# Patient Record
Sex: Male | Born: 1975 | Race: Black or African American | Hispanic: No | Marital: Single | Smoking: Former smoker
Health system: Southern US, Community
[De-identification: ages and names within clinical notes are randomized; demographics above are authoritative.]

## PROBLEM LIST (undated history)

## (undated) DIAGNOSIS — I639 Cerebral infarction, unspecified: Secondary | ICD-10-CM

## (undated) DIAGNOSIS — R011 Cardiac murmur, unspecified: Secondary | ICD-10-CM

## (undated) HISTORY — PX: EYE SURGERY: SHX253

---

## 2013-06-21 ENCOUNTER — Emergency Department (HOSPITAL_COMMUNITY)
Admission: EM | Admit: 2013-06-21 | Discharge: 2013-06-21 | Disposition: A | Discharge status: Home / Self Care | Payer: Self-pay | Attending: Emergency Medicine | Admitting: Emergency Medicine

## 2013-06-21 ENCOUNTER — Emergency Department (HOSPITAL_COMMUNITY): Payer: Self-pay

## 2013-06-21 ENCOUNTER — Encounter (HOSPITAL_COMMUNITY): Payer: Self-pay | Admitting: Emergency Medicine

## 2013-06-21 DIAGNOSIS — R0602 Shortness of breath: Secondary | ICD-10-CM | POA: Insufficient documentation

## 2013-06-21 DIAGNOSIS — R079 Chest pain, unspecified: Secondary | ICD-10-CM | POA: Insufficient documentation

## 2013-06-21 DIAGNOSIS — Z87891 Personal history of nicotine dependence: Secondary | ICD-10-CM | POA: Insufficient documentation

## 2013-06-21 LAB — I-STAT TROPONIN, ED: Troponin i, poc: 0 ng/mL (ref 0.00–0.08)

## 2013-06-21 LAB — CBC
HEMATOCRIT: 36.5 % — AB (ref 39.0–52.0)
Hemoglobin: 12 g/dL — ABNORMAL LOW (ref 13.0–17.0)
MCH: 30.2 pg (ref 26.0–34.0)
MCHC: 32.9 g/dL (ref 30.0–36.0)
MCV: 91.9 fL (ref 78.0–100.0)
PLATELETS: 185 10*3/uL (ref 150–400)
RBC: 3.97 MIL/uL — ABNORMAL LOW (ref 4.22–5.81)
RDW: 12.5 % (ref 11.5–15.5)
WBC: 5.2 10*3/uL (ref 4.0–10.5)

## 2013-06-21 LAB — BASIC METABOLIC PANEL
BUN: 14 mg/dL (ref 6–23)
CO2: 26 mEq/L (ref 19–32)
Calcium: 9.1 mg/dL (ref 8.4–10.5)
Chloride: 102 mEq/L (ref 96–112)
Creatinine, Ser: 0.94 mg/dL (ref 0.50–1.35)
Glucose, Bld: 87 mg/dL (ref 70–99)
POTASSIUM: 4.3 meq/L (ref 3.7–5.3)
Sodium: 141 mEq/L (ref 137–147)

## 2013-06-21 LAB — PRO B NATRIURETIC PEPTIDE: PRO B NATRI PEPTIDE: 36.5 pg/mL (ref 0–125)

## 2013-06-21 MED ORDER — ASPIRIN 81 MG PO CHEW: 324.0000 mg | CHEWABLE_TABLET | Freq: Once | ORAL | Status: DC

## 2013-06-21 MED ORDER — MORPHINE SULFATE 4 MG/ML IJ SOLN: 4.0000 mg | Freq: Once | INTRAMUSCULAR | Status: DC

## 2013-06-21 MED ORDER — NITROGLYCERIN 0.4 MG SL SUBL: 0.4000 mg | SUBLINGUAL_TABLET | SUBLINGUAL | Status: DC | PRN

## 2013-06-21 NOTE — ED Provider Notes (Signed)
Patient seen/examined in the Emergency Department [facility] Patient reports chest pain.  He reports he has had this for awhile Exam : awake/alert, maex4, no murmurs noted.  Lung sounds clear Plan: d/c home with cardiology followup.  I doubt ACS/PE/Dissection at this time BP 133/88  Pulse 65  Temp(Src) 98.4 F (36.9 C) (Oral)  Resp 16  Ht 5\' 11"  (1.803 m)  Wt 171 lb 9.6 oz (77.837 kg)  BMI 23.94 kg/m2  SpO2 99%   Joya Gaskinsonald W Jonanthony Nahar, MD 06/21/13 [id number]

## 2013-06-21 NOTE — ED Notes (Signed)
Patient transported to X-ray 

## 2013-06-21 NOTE — ED Notes (Signed)
Patient c/o chest pain that he states has been going on and off for years, unable to describe quality of pain very well but states it feels like pressure only when suggested, pain is reproduced when patient moves his arms.  Pain is intermittent and midsternal, no associated nausea but states he felt sob and hot last night.  States when it comes he feels dizzy.  He denies medical hx but states he has a brother on his 2nd pacemaker who is [phone], unable to give more information

## 2013-06-21 NOTE — ED Notes (Signed)
Pt states he was in prison for 19 years, released 8 days ago, and all testing have been completed through the prison system not an actual Cardiologist

## 2013-06-21 NOTE — ED Notes (Signed)
Patient left before receiving morphine or aspirin

## 2013-06-21 NOTE — Discharge Instructions (Signed)
Chest Pain (Nonspecific) °It is often hard to give a specific diagnosis for the cause of chest pain. There is always a chance that your pain could be related to something serious, such as a heart attack or a blood clot in the lungs. You need to follow up with your caregiver for further evaluation. °CAUSES  °· Heartburn. °· Pneumonia or bronchitis. °· Anxiety or stress. °· Inflammation around your heart (pericarditis) or lung (pleuritis or pleurisy). °· A blood clot in the lung. °· A collapsed lung (pneumothorax). It can develop suddenly on its own (spontaneous pneumothorax) or from injury (trauma) to the chest. °· Shingles infection (herpes zoster virus). °The chest wall is composed of bones, muscles, and cartilage. Any of these can be the source of the pain. °· The bones can be bruised by injury. °· The muscles or cartilage can be strained by coughing or overwork. °· The cartilage can be affected by inflammation and become sore (costochondritis). °DIAGNOSIS  °Lab tests or other studies, such as X-rays, electrocardiography, stress testing, or cardiac imaging, may be needed to find the cause of your pain.  °TREATMENT  °· Treatment depends on what may be causing your chest pain. Treatment may include: °· Acid blockers for heartburn. °· Anti-inflammatory medicine. °· Pain medicine for inflammatory conditions. °· Antibiotics if an infection is present. °· You may be advised to change lifestyle habits. This includes stopping smoking and avoiding alcohol, caffeine, and chocolate. °· You may be advised to keep your head raised (elevated) when sleeping. This reduces the chance of acid going backward from your stomach into your esophagus. °· Most of the time, nonspecific chest pain will improve within 2 to 3 days with rest and mild pain medicine. °HOME CARE INSTRUCTIONS  °· If antibiotics were prescribed, take your antibiotics as directed. Finish them even if you start to feel better. °· For the next few days, avoid physical  activities that bring on chest pain. Continue physical activities as directed. °· Do not smoke. °· Avoid drinking alcohol. °· Only take over-the-counter or prescription medicine for pain, discomfort, or fever as directed by your caregiver. °· Follow your caregiver's suggestions for further testing if your chest pain does not go away. °· Keep any follow-up appointments you made. If you do not go to an appointment, you could develop lasting (chronic) problems with pain. If there is any problem keeping an appointment, you must call to reschedule. °SEEK MEDICAL CARE IF:  °· You think you are having problems from the medicine you are taking. Read your medicine instructions carefully. °· Your chest pain does not go away, even after treatment. °· You develop a rash with blisters on your chest. °SEEK IMMEDIATE MEDICAL CARE IF:  °· You have increased chest pain or pain that spreads to your arm, neck, jaw, back, or abdomen. °· You develop shortness of breath, an increasing cough, or you are coughing up blood. °· You have severe back or abdominal pain, feel nauseous, or vomit. °· You develop severe weakness, fainting, or chills. °· You have a fever. °THIS IS AN EMERGENCY. Do not wait to see if the pain will go away. Get medical help at once. Call your local emergency services (911 in [place] Do not drive yourself to the hospital. °MAKE SURE YOU:  °· Understand these instructions. °· Will watch your condition. °· Will get help right away if you are not doing well or get worse. °Document Released: 01/13/2005 Document Revised: 06/28/2011 Document Reviewed: 11/09/2007 °ExitCare® Patient Information ©2014 [employer]   [employer]. ° °

## 2013-06-21 NOTE — ED Notes (Signed)
Pt presents with mid-sternum chest pain that radiates through out his chest and SOB that started last night. Pt reports these symptoms have been ongoing, he has followed up with a Cardiologist and has had several test completed and they all come back negative. Pt reports his younger brother has had 2 pacemakers inserted

## 2013-06-21 NOTE — ED Provider Notes (Signed)
CSN: [id number]     Arrival date & time 06/21/13  0809 History   First MD Initiated Contact with Patient 06/21/13 [phone]     Chief Complaint  Patient presents with  . Chest Pain  . Shortness of Breath     (Consider location/radiation/quality/duration/timing/severity/associated sxs/prior Treatment) HPI Comments: Patient presents emergency department with chief complaint of chest pain. He states the pain awakened him from sleep this morning around 3:00. He states that the pain is associated with shortness of breath. It is intermittent, waxing and waning. The symptoms do not radiate. He has had this pain many times before. He states it "feels like something is pulling, heart." He has had several workups by cardiology, including an echo which was normal. He has not tried taking anything to alleviate his symptoms. There are no aggravating or alleviating factors. Denies any other health problems. He states that his brother has a pacemaker.  The history is provided by the patient. No language interpreter was used.    History reviewed. No pertinent past medical history. Past Surgical History  Procedure Laterality Date  . Eye surgery     History reviewed. No pertinent family history. History  Substance Use Topics  . Smoking status: Former Games developermoker  . Smokeless tobacco: Not on file  . Alcohol Use: No    Review of Systems  All other systems reviewed and are negative.      Allergies  Review of patient's allergies indicates no known allergies.  Home Medications  No current outpatient prescriptions on file. BP 133/88  Pulse 65  Temp(Src) 98.4 F (36.9 C) (Oral)  Resp 16  Ht 5\' 11"  (1.803 m)  Wt 171 lb 9.6 oz (77.837 kg)  BMI 23.94 kg/m2  SpO2 99% Physical Exam  Nursing note and vitals reviewed. Constitutional: He is oriented to person, place, and time. He appears well-developed and well-nourished.  HENT:  Head: Normocephalic and atraumatic.  Eyes: Conjunctivae and EOM are normal.  Pupils are equal, round, and reactive to light. Right eye exhibits no discharge. Left eye exhibits no discharge. No scleral icterus.  Neck: Normal range of motion. Neck supple. No JVD present.  Cardiovascular: Normal rate, regular rhythm and normal heart sounds.  Exam reveals no gallop and no friction rub.   No murmur heard. Pulmonary/Chest: Effort normal and breath sounds normal. No respiratory distress. He has no wheezes. He has no rales. He exhibits no tenderness.  Abdominal: Soft. He exhibits no distension and no mass. There is no tenderness. There is no rebound and no guarding.  Musculoskeletal: Normal range of motion. He exhibits no edema and no tenderness.  Neurological: He is alert and oriented to person, place, and time.  Skin: Skin is warm and dry.  Psychiatric: He has a normal mood and affect. His behavior is normal. Judgment and thought content normal.    ED Course  Procedures (including critical care time) Labs Review Results for orders placed during the hospital encounter of 06/21/13  CBC      Result Value Ref Range   WBC 5.2  4.0 - 10.5 K/uL   RBC 3.97 (*) 4.22 - 5.81 MIL/uL   Hemoglobin 12.0 (*) 13.0 - 17.0 g/dL   HCT 29.536.5 (*) 62.139.0 - 30.852.0 %   MCV 91.9  78.0 - 100.0 fL   MCH 30.2  26.0 - 34.0 pg   MCHC 32.9  30.0 - 36.0 g/dL   RDW 65.712.5  84.611.5 - 96.215.5 %   Platelets 185  150 - 400 K/uL  BASIC METABOLIC PANEL      Result Value Ref Range   Sodium 141  137 - 147 mEq/L   Potassium 4.3  3.7 - 5.3 mEq/L   Chloride 102  96 - 112 mEq/L   CO2 26  19 - 32 mEq/L   Glucose, Bld 87  70 - 99 mg/dL   BUN 14  6 - 23 mg/dL   Creatinine, Ser 9.60  0.50 - 1.35 mg/dL   Calcium 9.1  8.4 - 45.4 mg/dL   GFR calc non Af Amer >90  >90 mL/min   GFR calc Af Amer >90  >90 mL/min  PRO B NATRIURETIC PEPTIDE      Result Value Ref Range   Pro B Natriuretic peptide (BNP) 36.5  0 - 125 pg/mL  I-STAT TROPOININ, ED      Result Value Ref Range   Troponin i, poc 0.00  0.00 - 0.08 ng/mL   Comment 3             Dg Chest 2 View  06/21/2013   CLINICAL DATA:  Chest pain  EXAM: CHEST  2 VIEW  COMPARISON:  None.  FINDINGS: Lungs are clear. Heart size and pulmonary vascularity are normal. No pneumothorax. No adenopathy. 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  IMPRESSION: No edema or consolidation.   Electronically Signed   By: Bretta Bang M.D.   On: 06/21/2013 09:50    Imaging Review No results found.   EKG Interpretation   Date/Time:  Thursday June 21 2013 08:16:36 EST Ventricular Rate:  64 PR Interval:  114 QRS Duration: 84 QT Interval:  376 QTC Calculation: 387 R Axis:   65 Text Interpretation:  Normal sinus rhythm Normal ECG Confirmed by Bebe Shaggy   MD, DONALD ([id number]) on 06/21/2013 9:47:15 AM      MDM   Final diagnoses:  Chest pain    Patient with chest pain. He has had this pain before. The pain started this morning around 3:00. It is waxing and waning. Heart score is 2. PERC negative. Basic labs are unremarkable. EKG is normal and unchanged. Patient had an echo and December 2013 which was normal.  10:00 AM Patient seen by and discussed with Dr. Bebe Shaggy, who agrees that the patient is low-risk and go home.  Recommend follow-up with Cardiology.  Patient is stable and ready for discharge.   Roxy Horseman, PA-C 06/21/13 1001

## 2013-06-22 NOTE — ED Provider Notes (Signed)
Medical screening examination/treatment/procedure(s) were conducted as a shared visit with non-physician practitioner(s) and myself.  I personally evaluated the patient during the encounter.   EKG Interpretation   Date/Time:  Thursday June 21 2013 08:16:36 EST Ventricular Rate:  64 PR Interval:  114 QRS Duration: 84 QT Interval:  376 QTC Calculation: 387 R Axis:   65 Text Interpretation:  Normal sinus rhythm Normal ECG Confirmed by Bebe ShaggyWICKLINE   MD, Burch Marchuk ([id number]) on 06/21/2013 9:47:15 AM        Joya Gaskinsonald W Elisabella Hacker, MD 06/22/13 [phone]

## 2013-07-10 ENCOUNTER — Encounter: Payer: Self-pay | Admitting: Cardiovascular Disease

## 2013-07-10 ENCOUNTER — Ambulatory Visit (INDEPENDENT_AMBULATORY_CARE_PROVIDER_SITE_OTHER): Payer: Self-pay | Admitting: Cardiovascular Disease

## 2013-07-10 VITALS — BP 140/76 | HR 81 | Ht 71.0 in | Wt 183.0 lb

## 2013-07-10 DIAGNOSIS — R079 Chest pain, unspecified: Secondary | ICD-10-CM

## 2013-07-10 NOTE — Assessment & Plan Note (Signed)
Atypical  F/U ETT

## 2013-07-10 NOTE — Patient Instructions (Signed)
Your physician recommends that you schedule a follow-up appointment  As needed with Dr. Eden EmmsNishan  Your physician has requested that you have an exercise tolerance test. Can be done with NP or PA.  For further information please visit [email or web address]. Please also follow instruction sheet, as given.

## 2013-07-10 NOTE — Progress Notes (Signed)
Patient ID: Garrett BullocksBrian Dudley, male   DOB: [birth year], 38 y.o.   MRN: [id number]  38 yo referred from ER 3/5 for chest pain  Patient presents emergency department with chief complaint of chest pain. He states the pain awakened him from sleep 3/5  around 3:00. He states that the pain is associated with shortness of breath. It is intermittent, waxing and waning. The symptoms do not radiate. He has had this pain many times before. He states it "feels like something is pulling, heart." He has had several workups by cardiology, including an echo which was normal ? 4 years ago. He has not tried taking anything to alleviate his symptoms. There are no aggravating or alleviating factors. Denies any other health problems. He states that his brother has a pacemaker and mom had heart attack    He was just released from prison and is living at a half way house Denies drugs ETOH  Recurrent atypical pains in chest since 3/5  Sharp fleeting and nonexertional      ROS: Denies fever, malais, weight loss, blurry vision, decreased visual acuity, cough, sputum, SOB, hemoptysis, pleuritic pain, palpitaitons, heartburn, abdominal pain, melena, lower extremity edema, claudication, or rash.  All other systems reviewed and negative   General: Affect appropriate Healthy:  appears stated age HEENT: normal Neck supple with no adenopathy JVP normal no bruits no thyromegaly Lungs clear with no wheezing and good diaphragmatic motion Heart:  S1/S2 no murmur,rub, gallop or click PMI normal Abdomen: benighn, BS positve, no tenderness, no AAA no bruit.  No HSM or HJR Distal pulses intact with no bruits No edema Neuro non-focal Skin warm and dry No muscular weakness  Medications Current Outpatient Prescriptions  Medication Sig Dispense Refill  . Chlorpheniramine Maleate (ALLERGY PO) Take by mouth as directed.       No current facility-administered medications for this visit.    Allergies Review of patient's allergies  indicates no known allergies.  Family History: No family history on file.  Social History: History   Social History  . Marital Status: Single    Spouse Name: N/A    Number of Children: N/A  . Years of Education: N/A   Occupational History  . Not on file.   Social History Main Topics  . Smoking status: Former Games developermoker  . Smokeless tobacco: Not on file  . Alcohol Use: No  . Drug Use: Yes    Special: Marijuana  . Sexual Activity: Not on file   Other Topics Concern  . Not on file   Social History Narrative  . No narrative on file    Electrocardiogram:  SR [id number]   Assessment and Plan

## 2013-07-18 ENCOUNTER — Other Ambulatory Visit (HOSPITAL_COMMUNITY): Payer: Self-pay | Admitting: Cardiovascular Disease

## 2013-07-18 ENCOUNTER — Telehealth (HOSPITAL_COMMUNITY): Payer: Self-pay

## 2013-07-18 DIAGNOSIS — R079 Chest pain, unspecified: Secondary | ICD-10-CM

## 2013-07-19 ENCOUNTER — Ambulatory Visit (HOSPITAL_COMMUNITY)
Admission: RE | Admit: 2013-07-19 | Discharge: 2013-07-19 | Disposition: A | Discharge status: Home / Self Care | Payer: Self-pay | Source: Ambulatory Visit | Attending: Cardiovascular Disease | Admitting: Cardiovascular Disease

## 2013-07-19 ENCOUNTER — Other Ambulatory Visit: Payer: Self-pay

## 2013-07-19 DIAGNOSIS — R079 Chest pain, unspecified: Secondary | ICD-10-CM | POA: Insufficient documentation

## 2013-07-23 ENCOUNTER — Telehealth: Payer: Self-pay | Admitting: *Deleted

## 2013-07-23 NOTE — Telephone Encounter (Signed)
Carey Bullocks ','<More Detail >>       Wendall Stade, MD       Sent: Wynelle Link July 22, 2013 12:19 PM    To: Alois Cliche, LPN                   Message     Normal ETT         ----- Message -----    From: Yvetta Coder    Sent: 07/20/2013 7:27 AM    To: Wendall Stade, MD                                  Results Exercise Tolerance Test (Order [id number])      Exercise Tolerance Test   Status: Final result Visible to patient: This result is not viewable by the patient. Next appt: None         Specimen Collected: 07/19/13 10:30 AM Last Resulted: 07/19/13 5:16 PM          Linked Documents     View Image               Result Information     Status     Final result (07/19/2013 5:16 PM)     Provider Status: Ordered    Encounter      View Encounter                Lab Information     MUSE               Order-Level Documents:     There are no order-level documents.         Exercise Tolerance Test (Order [id number]) Ordering: [facility] Interface Date: 07/19/2013  ECG Authorizing: Provider Default, MD Department: DEFAULT LOCATION  Order: [id number]         Provider Default, MD  NPI:         Order Information     Order Date/Time Release Date/Time Start Date/Time End Date/Time    07/19/13 10:30 AM None 07/19/13 10:30 AM None           Order Details     Frequency Duration Priority Order Class    None None Routine Normal           Comments     Ordered by Charlton Haws           Order History Outpatient     Date/Time Action Taken User Additional Information    07/19/13 1030 Sign [facility] Interface     07/19/13 1716 Result [facility] Interface Final           Collection Information     Collected: 07/19/2013 10:30 AM Resulting Agency: [facility]          Patient Information     Patient Name Sex DOB SSN    Adynn, Caseres Male 1975/06/02 [id number]            Additional Information     Associated Reports    Priority and Order Details    Collection Information.           Order Building surveyor [facility] Interface -- -- --    Barrister's clerk Default, MD -- -- --  Encounter     View Encounter            Order-Level Documents:     There are no order-level documents.         PT  AWARE OF  GXT  RESULTS .Zack Seal/CY

## 2013-10-03 ENCOUNTER — Emergency Department (HOSPITAL_COMMUNITY)
Admission: EM | Admit: 2013-10-03 | Discharge: 2013-10-03 | Disposition: A | Discharge status: Home / Self Care | Payer: Self-pay | Attending: Emergency Medicine | Admitting: Emergency Medicine

## 2013-10-03 ENCOUNTER — Encounter (HOSPITAL_COMMUNITY): Payer: Self-pay | Admitting: Emergency Medicine

## 2013-10-03 ENCOUNTER — Emergency Department (HOSPITAL_COMMUNITY): Payer: Self-pay

## 2013-10-03 DIAGNOSIS — R5383 Other fatigue: Secondary | ICD-10-CM

## 2013-10-03 DIAGNOSIS — R42 Dizziness and giddiness: Secondary | ICD-10-CM | POA: Insufficient documentation

## 2013-10-03 DIAGNOSIS — J3489 Other specified disorders of nose and nasal sinuses: Secondary | ICD-10-CM | POA: Insufficient documentation

## 2013-10-03 DIAGNOSIS — R110 Nausea: Secondary | ICD-10-CM | POA: Insufficient documentation

## 2013-10-03 DIAGNOSIS — R5381 Other malaise: Secondary | ICD-10-CM | POA: Insufficient documentation

## 2013-10-03 DIAGNOSIS — R0981 Nasal congestion: Secondary | ICD-10-CM

## 2013-10-03 LAB — CBC WITH DIFFERENTIAL/PLATELET
BASOS PCT: 1 % (ref 0–1)
Basophils Absolute: 0 10*3/uL (ref 0.0–0.1)
EOS ABS: 0.4 10*3/uL (ref 0.0–0.7)
EOS PCT: 9 % — AB (ref 0–5)
HEMATOCRIT: 41.7 % (ref 39.0–52.0)
Hemoglobin: 13.5 g/dL (ref 13.0–17.0)
Lymphocytes Relative: 32 % (ref 12–46)
Lymphs Abs: 1.6 10*3/uL (ref 0.7–4.0)
MCH: 30.1 pg (ref 26.0–34.0)
MCHC: 32.4 g/dL (ref 30.0–36.0)
MCV: 93.1 fL (ref 78.0–100.0)
MONO ABS: 0.4 10*3/uL (ref 0.1–1.0)
Monocytes Relative: 8 % (ref 3–12)
Neutro Abs: 2.5 10*3/uL (ref 1.7–7.7)
Neutrophils Relative %: 50 % (ref 43–77)
Platelets: 202 10*3/uL (ref 150–400)
RBC: 4.48 MIL/uL (ref 4.22–5.81)
RDW: 12.4 % (ref 11.5–15.5)
WBC: 4.8 10*3/uL (ref 4.0–10.5)

## 2013-10-03 LAB — BASIC METABOLIC PANEL
BUN: 16 mg/dL (ref 6–23)
CALCIUM: 9.3 mg/dL (ref 8.4–10.5)
CO2: 24 mEq/L (ref 19–32)
CREATININE: 1.07 mg/dL (ref 0.50–1.35)
Chloride: 106 mEq/L (ref 96–112)
GFR, EST NON AFRICAN AMERICAN: 85 mL/min — AB (ref >90)
Glucose, Bld: 121 mg/dL — ABNORMAL HIGH (ref 70–99)
Potassium: 4.4 mEq/L (ref 3.7–5.3)
Sodium: 142 mEq/L (ref 137–147)

## 2013-10-03 LAB — CBG MONITORING, ED: Glucose-Capillary: 133 mg/dL — ABNORMAL HIGH (ref 70–99)

## 2013-10-03 MED ORDER — FLUTICASONE PROPIONATE 50 MCG/ACT NA SUSP
1.0000 | Freq: Every day | NASAL | Status: DC
  Administered 2013-10-03: 1 via NASAL
  Filled 2013-10-03: qty 16

## 2013-10-03 MED ORDER — LORATADINE 10 MG PO TABS
10.0000 mg | ORAL_TABLET | Freq: Every day | ORAL | Status: DC
  Administered 2013-10-03: 10 mg via ORAL
  Filled 2013-10-03: qty 1

## 2013-10-03 MED ORDER — CETIRIZINE-PSEUDOEPHEDRINE ER 5-120 MG PO TB12: 1.0000 | ORAL_TABLET | Freq: Two times a day (BID) | ORAL | Status: DC

## 2013-10-03 MED ORDER — FLUTICASONE PROPIONATE 50 MCG/ACT NA SUSP: 1.0000 | Freq: Every day | NASAL | Status: DC

## 2013-10-03 NOTE — ED Provider Notes (Signed)
CSN: [id number]     Arrival date & time 10/03/13  1510 History   First MD Initiated Contact with Patient 10/03/13 2009     Chief Complaint  Patient presents with  . Dizziness   HPI  History provided by the patient. Patient is a 38 year old male with no significant PMH who presents with complaints of dizziness and fatigue. Patient states that he has had general fatigue often feeling very tired throughout the day and wanting to fall asleep for several weeks. Within this last week he has also had dizziness where he feels off balance. His weakness is generalized to all extremities. He denies having any symptoms of headache but states he has had continual sinus pressure and congestion. States this has been somewhat ongoing problem for a long time. His fatigue has been interfering with his daily life including loss of libido. He denies having any depression. Denies any SI or HI. No significant family history for medical illnesses.    History reviewed. No pertinent past medical history. Past Surgical History  Procedure Laterality Date  . Eye surgery     History reviewed. No pertinent family history. History  Substance Use Topics  . Smoking status: Former Games developermoker  . Smokeless tobacco: Not on file  . Alcohol Use: No    Review of Systems  Constitutional: Positive for fatigue. Negative for fever, chills and diaphoresis.  HENT: Positive for congestion and sinus pressure.   Respiratory: Negative for shortness of breath.   Cardiovascular: Negative for chest pain.  Gastrointestinal: Positive for nausea. Negative for vomiting, abdominal pain, diarrhea and constipation.  Endocrine: Negative for polydipsia and polyuria.  Neurological: Positive for dizziness and light-headedness. Negative for headaches.  All other systems reviewed and are negative.     Allergies  Review of patient's allergies indicates no known allergies.  Home Medications   Prior to Admission medications   Medication Sig  Start Date End Date Taking? Authorizing Provider  Chlorpheniramine Maleate (ALLERGY PO) Take by mouth as directed.   Yes Historical Provider, MD   BP 130/78  Pulse 65  Temp(Src) 98.5 F (36.9 C) (Oral)  Resp 15  Ht 5' 11.5" (1.816 m)  Wt 184 lb 6 oz (83.632 kg)  BMI 25.36 kg/m2  SpO2 100% Physical Exam  Nursing note and vitals reviewed. Constitutional: He is oriented to person, place, and time. He appears well-developed and well-nourished. No distress.  HENT:  Head: Normocephalic and atraumatic.  No sinus pressure or nasal congestion  Eyes: Conjunctivae and EOM are normal. Pupils are equal, round, and reactive to light.  Neck: Normal range of motion. Neck supple.  No meningeal sign  Cardiovascular: Normal rate and regular rhythm.   Pulmonary/Chest: Effort normal and breath sounds normal. No respiratory distress. He has no wheezes.  Abdominal: Soft.  Musculoskeletal: Normal range of motion.  Neurological: He is alert and oriented to person, place, and time. He has normal strength. No cranial nerve deficit or sensory deficit. Coordination and gait normal.  Skin: Skin is warm. No rash noted.  Psychiatric: He has a normal mood and affect. His behavior is normal.    ED Course  Procedures   COORDINATION OF CARE:  Nursing notes reviewed. Vital signs reviewed. Initial pt interview and examination performed.   Filed Vitals:   10/03/13 1900 10/03/13 1915 10/03/13 1930 10/03/13 1945  BP: 128/80 125/72 135/80 130/78  Pulse: 68 58 59 65  Temp:      TempSrc:      Resp: 18 15 13  15  Height:      Weight:      SpO2: 100% 99% 100% 100%    8:11 PM-patient seen and evaluated. He is well-appearing no acute distress. Strength is equal bilaterally. He does report feeling weak lightheaded and near syncopal with standing. Orthostatic vitals ordered.  Laboratory testing unremarkable. Normal orthostatic vitals. At this time no concerning findings to explain his general fatigue and his new  dizziness symptoms.   The patient appears reasonably screened and/or stabilized for discharge and I doubt any other medical condition or other [facility] requiring further screening, evaluation, or treatment in the ED at this time prior to discharge.     Treatment plan initiated:Medications - No data to display  Results for orders placed during the hospital encounter of 10/03/13  CBC WITH DIFFERENTIAL      Result Value Ref Range   WBC 4.8  4.0 - 10.5 K/uL   RBC 4.48  4.22 - 5.81 MIL/uL   Hemoglobin 13.5  13.0 - 17.0 g/dL   HCT 16.141.7  09.639.0 - 04.552.0 %   MCV 93.1  78.0 - 100.0 fL   MCH 30.1  26.0 - 34.0 pg   MCHC 32.4  30.0 - 36.0 g/dL   RDW 40.912.4  81.111.5 - 91.415.5 %   Platelets 202  150 - 400 K/uL   Neutrophils Relative % 50  43 - 77 %   Neutro Abs 2.5  1.7 - 7.7 K/uL   Lymphocytes Relative 32  12 - 46 %   Lymphs Abs 1.6  0.7 - 4.0 K/uL   Monocytes Relative 8  3 - 12 %   Monocytes Absolute 0.4  0.1 - 1.0 K/uL   Eosinophils Relative 9 (*) 0 - 5 %   Eosinophils Absolute 0.4  0.0 - 0.7 K/uL   Basophils Relative 1  0 - 1 %   Basophils Absolute 0.0  0.0 - 0.1 K/uL  BASIC METABOLIC PANEL      Result Value Ref Range   Sodium 142  137 - 147 mEq/L   Potassium 4.4  3.7 - 5.3 mEq/L   Chloride 106  96 - 112 mEq/L   CO2 24  19 - 32 mEq/L   Glucose, Bld 121 (*) 70 - 99 mg/dL   BUN 16  6 - 23 mg/dL   Creatinine, Ser 7.821.07  0.50 - 1.35 mg/dL   Calcium 9.3  8.4 - 95.610.5 mg/dL   GFR calc non Af Amer 85 (*) >90 mL/min   GFR calc Af Amer >90  >90 mL/min  CBG MONITORING, ED      Result Value Ref Range   Glucose-Capillary 133 (*) 70 - 99 mg/dL       Imaging Review Ct Head Wo Contrast  10/03/2013   CLINICAL DATA:  Dizziness.  EXAM: CT HEAD WITHOUT CONTRAST  TECHNIQUE: Contiguous axial images were obtained from the base of the skull through the vertex without intravenous contrast.  COMPARISON:  None.  FINDINGS: No mass lesion, mass effect, midline shift, hydrocephalus, hemorrhage. No territorial ischemia or  acute infarction. Paranasal sinuses appear normal.  IMPRESSION: Negative CT head.   Electronically Signed   By: Andreas NewportGeoffrey  Lamke M.D.   On: 10/03/2013 21:42     MDM   Final diagnoses:  Fatigue  Dizziness  Sinus congestion        Angus Sellereter S Dammen, PA-C 10/04/13 [phone]

## 2013-10-03 NOTE — Discharge Instructions (Signed)
Your testing and CAT scan of your head and brain has not shown any signs for a concerning your emergent cause severe symptoms. Please followup with a primary care provider for continued evaluation and treatment.   Fatigue Fatigue is a feeling of tiredness, lack of energy, lack of motivation, or feeling tired all the time. Having enough rest, good nutrition, and reducing stress will normally reduce fatigue. Consult your caregiver if it persists. The nature of your fatigue will help your caregiver to find out its cause. The treatment is based on the cause.  CAUSES  There are many causes for fatigue. Most of the time, fatigue can be traced to one or more of your habits or routines. Most causes fit into one or more of three general areas. They are: Lifestyle problems  Sleep disturbances.  Overwork.  Physical exertion.  Unhealthy habits.  Poor eating habits or eating disorders.  Alcohol and/or drug use .  Lack of proper nutrition (malnutrition). Psychological problems  Stress and/or anxiety problems.  Depression.  Grief.  Boredom. Medical Problems or Conditions  Anemia.  Pregnancy.  Thyroid gland problems.  Recovery from major surgery.  Continuous pain.  Emphysema or asthma that is not well controlled  Allergic conditions.  Diabetes.  Infections (such as mononucleosis).  Obesity.  Sleep disorders, such as sleep apnea.  Heart failure or other heart-related problems.  Cancer.  Kidney disease.  Liver disease.  Effects of certain medicines such as antihistamines, cough and cold remedies, prescription pain medicines, heart and blood pressure medicines, drugs used for treatment of cancer, and some antidepressants. SYMPTOMS  The symptoms of fatigue include:   Lack of energy.  Lack of drive (motivation).  Drowsiness.  Feeling of indifference to the surroundings. DIAGNOSIS  The details of how you feel help guide your caregiver in finding out what is  causing the fatigue. You will be asked about your present and past health condition. It is important to review all medicines that you take, including prescription and non-prescription items. A thorough exam will be done. You will be questioned about your feelings, habits, and normal lifestyle. Your caregiver may suggest blood tests, urine tests, or other tests to look for common medical causes of fatigue.  TREATMENT  Fatigue is treated by correcting the underlying cause. For example, if you have continuous pain or depression, treating these causes will improve how you feel. Similarly, adjusting the dose of certain medicines will help in reducing fatigue.  HOME CARE INSTRUCTIONS   Try to get the required amount of good sleep every night.  Eat a healthy and nutritious diet, and drink enough water throughout the day.  Practice ways of relaxing (including yoga or meditation).  Exercise regularly.  Make plans to change situations that cause stress. Act on those plans so that stresses decrease over time. Keep your work and personal routine reasonable.  Avoid street drugs and minimize use of alcohol.  Start taking a daily multivitamin after consulting your caregiver. SEEK MEDICAL CARE IF:   You have persistent tiredness, which cannot be accounted for.  You have fever.  You have unintentional weight loss.  You have headaches.  You have disturbed sleep throughout the night.  You are feeling sad.  You have constipation.  You have dry skin.  You have gained weight.  You are taking any new or different medicines that you suspect are causing fatigue.  You are unable to sleep at night.  You develop any unusual swelling of your legs or other parts of  your body. SEEK IMMEDIATE MEDICAL CARE IF:   You are feeling confused.  Your vision is blurred.  You feel faint or pass out.  You develop severe headache.  You develop severe abdominal, pelvic, or back pain.  You develop chest  pain, shortness of breath, or an irregular or fast heartbeat.  You are unable to pass a normal amount of urine.  You develop abnormal bleeding such as bleeding from the rectum or you vomit blood.  You have thoughts about harming yourself or committing suicide.  You are worried that you might harm someone else. MAKE SURE YOU:   Understand these instructions.  Will watch your condition.  Will get help right away if you are not doing well or get worse. Document Released: 01/31/2007 Document Revised: 06/28/2011 Document Reviewed: 01/31/2007 [facility]ExitCare Patient Information 2015 [place], [place]. This information is not intended to replace advice given to you by your health care provider. Make sure you discuss any questions you have with your health care provider.   Dizziness  Dizziness means you feel unsteady or lightheaded. You might feel like you are going to pass out (faint). HOME CARE   Drink enough fluids to keep your pee (urine) clear or pale yellow.  Take your medicines exactly as told by your doctor. If you take blood pressure medicine, always stand up slowly from the lying or sitting position. Hold on to something to steady yourself.  If you need to stand in one place for a long time, move your legs often. Tighten and relax your leg muscles.  Have someone stay with you until you feel okay.  Do not drive or use heavy machinery if you feel dizzy.  Do not drink alcohol. GET HELP RIGHT AWAY IF:   You feel dizzy or lightheaded and it gets worse.  You feel sick to your stomach (nauseous), or you throw up (vomit).  You have trouble talking or walking.  You feel weak or have trouble using your arms, hands, or legs.  You cannot think clearly or have trouble forming sentences.  You have chest pain, belly (abdominal) pain, sweating, or you are short of breath.  Your vision changes.  You are bleeding.  You have problems from your medicine that seem to be getting worse. MAKE  SURE YOU:   Understand these instructions.  Will watch your condition.  Will get help right away if you are not doing well or get worse. Document Released: 03/25/2011 Document Revised: 06/28/2011 Document Reviewed: 03/25/2011 [facility] Patient Information 2015 [place], [place]. This information is not intended to replace advice given to you by your health care provider. Make sure you discuss any questions you have with your health care provider.

## 2013-10-03 NOTE — ED Notes (Addendum)
Pt. Having periods of dizzines, unable to focus.  Stomach feels bitter These symptoms come and go and began over a month ago,.  He also reports that he feels like he is loosing power in his lt. Side.  PT. Denies any pain. No neuro deficits noted

## 2013-10-06 NOTE — ED Provider Notes (Signed)
Medical screening examination/treatment/procedure(s) were performed by non-physician practitioner and as supervising physician I was immediately available for consultation/collaboration.   EKG Interpretation None        Rolland PorterMark Rossie Bretado, MD 10/06/13 1024

## 2013-10-08 ENCOUNTER — Ambulatory Visit: Payer: Self-pay | Attending: Internal Medicine | Admitting: Internal Medicine

## 2013-10-08 ENCOUNTER — Encounter: Payer: Self-pay | Admitting: Internal Medicine

## 2013-10-08 VITALS — BP 136/87 | HR 91 | Temp 98.5°F | Resp 14 | Ht 71.0 in | Wt 189.0 lb

## 2013-10-08 DIAGNOSIS — Z87891 Personal history of nicotine dependence: Secondary | ICD-10-CM | POA: Insufficient documentation

## 2013-10-08 DIAGNOSIS — R5383 Other fatigue: Principal | ICD-10-CM

## 2013-10-08 DIAGNOSIS — J3489 Other specified disorders of nose and nasal sinuses: Secondary | ICD-10-CM | POA: Insufficient documentation

## 2013-10-08 DIAGNOSIS — R51 Headache: Secondary | ICD-10-CM | POA: Insufficient documentation

## 2013-10-08 DIAGNOSIS — R5381 Other malaise: Secondary | ICD-10-CM | POA: Insufficient documentation

## 2013-10-08 DIAGNOSIS — R42 Dizziness and giddiness: Secondary | ICD-10-CM | POA: Insufficient documentation

## 2013-10-08 LAB — CBC WITH DIFFERENTIAL/PLATELET
BASOS ABS: 0.1 10*3/uL (ref 0.0–0.1)
Basophils Relative: 1 % (ref 0–1)
EOS PCT: 11 % — AB (ref 0–5)
Eosinophils Absolute: 0.6 10*3/uL (ref 0.0–0.7)
HEMATOCRIT: 40 % (ref 39.0–52.0)
Hemoglobin: 13.5 g/dL (ref 13.0–17.0)
LYMPHS ABS: 1.7 10*3/uL (ref 0.7–4.0)
Lymphocytes Relative: 33 % (ref 12–46)
MCH: 30.3 pg (ref 26.0–34.0)
MCHC: 33.8 g/dL (ref 30.0–36.0)
MCV: 89.7 fL (ref 78.0–100.0)
MONO ABS: 0.4 10*3/uL (ref 0.1–1.0)
Monocytes Relative: 7 % (ref 3–12)
Neutro Abs: 2.4 10*3/uL (ref 1.7–7.7)
Neutrophils Relative %: 48 % (ref 43–77)
Platelets: 203 10*3/uL (ref 150–400)
RBC: 4.46 MIL/uL (ref 4.22–5.81)
RDW: 12.8 % (ref 11.5–15.5)
WBC: 5.1 10*3/uL (ref 4.0–10.5)

## 2013-10-08 LAB — COMPLETE METABOLIC PANEL WITH GFR
ALT: 26 U/L (ref 0–53)
AST: 31 U/L (ref 0–37)
Albumin: 4.3 g/dL (ref 3.5–5.2)
Alkaline Phosphatase: 49 U/L (ref 39–117)
BUN: 15 mg/dL (ref 6–23)
CO2: 27 mEq/L (ref 19–32)
Calcium: 9.2 mg/dL (ref 8.4–10.5)
Chloride: 103 mEq/L (ref 96–112)
Creat: 1.14 mg/dL (ref 0.50–1.35)
GFR, Est African American: >89 mL/min
GFR, Est Non African American: 80 mL/min
Glucose, Bld: 85 mg/dL (ref 70–99)
Potassium: 4.5 mEq/L (ref 3.5–5.3)
SODIUM: 138 meq/L (ref 135–145)
Total Bilirubin: 0.5 mg/dL (ref 0.2–1.2)
Total Protein: 6.5 g/dL (ref 6.0–8.3)

## 2013-10-08 LAB — LIPID PANEL
CHOL/HDL RATIO: 3 ratio
CHOLESTEROL: 151 mg/dL (ref 0–200)
HDL: 50 mg/dL (ref >39)
LDL Cholesterol: 73 mg/dL (ref 0–99)
Triglycerides: 139 mg/dL (ref <150)
VLDL: 28 mg/dL (ref 0–40)

## 2013-10-08 LAB — TSH: TSH: 2.277 u[IU]/mL (ref 0.350–4.500)

## 2013-10-08 LAB — POCT GLYCOSYLATED HEMOGLOBIN (HGB A1C): Hemoglobin A1C: 4.6

## 2013-10-08 NOTE — Progress Notes (Signed)
Patient ID: Garrett BullocksBrian Dudley, male   DOB: Jun 11, 1975, 38 y.o.   MRN: [id number]  CC: New patient  HPI: 38 year old male with no significant past medical history, has recently got out of prison who presented to clinic to establish primary care physician. Patient complains of being chronically fatigued, tired, not being able to sleep. He also complains of loss of libido. No abdominal pain. No chest pain. No shortness of breath.  No Known Allergies History reviewed. No pertinent past medical history. Current Outpatient Prescriptions on File Prior to Visit  Medication Sig Dispense Refill  . cetirizine-pseudoephedrine (ZYRTEC-D) 5-120 MG per tablet Take 1 tablet by mouth 2 (two) times daily.  30 tablet  0  . Chlorpheniramine Maleate (ALLERGY PO) Take by mouth as directed.      . fluticasone (FLONASE) 50 MCG/ACT nasal spray Place 1 spray into both nostrils daily.  16 g  2   No current facility-administered medications on file prior to visit.   History reviewed. No pertinent family history. History   Social History  . Marital Status: Single    Spouse Name: N/A    Number of Children: N/A  . Years of Education: N/A   Occupational History  . Not on file.   Social History Main Topics  . Smoking status: Former Games developermoker  . Smokeless tobacco: Not on file  . Alcohol Use: No  . Drug Use: Yes    Special: Marijuana  . Sexual Activity: Not on file   Other Topics Concern  . Not on file   Social History Narrative  . No narrative on file    Review of Systems  Constitutional: Negative for fever, chills, diaphoresis, activity change, appetite change and fatigue.  HENT: Negative for ear pain, nosebleeds, congestion, facial swelling, rhinorrhea, neck pain, neck stiffness and ear discharge.   Eyes: Negative for pain, discharge, redness, itching and visual disturbance.  Respiratory: Negative for cough, choking, chest tightness, shortness of breath, wheezing and stridor.   Cardiovascular: Negative for  chest pain, palpitations and leg swelling.  Gastrointestinal: Negative for abdominal distention.  Genitourinary: Negative for dysuria, urgency, frequency, hematuria, flank pain, decreased urine volume, difficulty urinating and dyspareunia.  Musculoskeletal: Negative for back pain, joint swelling, arthralgias and gait problem.  Neurological: Negative for dizziness, tremors, seizures, syncope, facial asymmetry  Hematological: Negative for adenopathy. Does not bruise/bleed easily.  Psychiatric/Behavioral: Negative for hallucinations, behavioral problems, confusion, dysphoric mood, decreased concentration and agitation.    Objective:   Filed Vitals:   10/08/13 0935  BP: 136/87  Pulse: 91  Temp: 98.5 F (36.9 C)  Resp: 14    Physical Exam  Constitutional: Appears well-developed and well-nourished. No distress.  HENT: Normocephalic. External right and left ear normal. Oropharynx is clear and moist.  Eyes: Conjunctivae and EOM are normal. PERRLA, no scleral icterus.  Neck: Normal ROM. Neck supple. No JVD. No tracheal deviation. No thyromegaly.  CVS: RRR, S1/S2 +, no murmurs, no gallops, no carotid bruit.  Pulmonary: Effort and breath sounds normal, no stridor, rhonchi, wheezes, rales.  Abdominal: Soft. BS +,  no distension, tenderness, rebound or guarding.  Musculoskeletal: Normal range of motion. No edema and no tenderness.  Lymphadenopathy: No lymphadenopathy noted, cervical, inguinal. Neuro: Alert. Normal reflexes, muscle tone coordination. No cranial nerve deficit. Skin: Skin is warm and dry. No rash noted. Not diaphoretic. No erythema. No pallor.  Psychiatric: Normal mood and affect. Behavior, judgment, thought content normal.   Lab Results  Component Value Date   WBC 4.8 10/03/2013  HGB 13.5 10/03/2013   HCT 41.7 10/03/2013   MCV 93.1 10/03/2013   PLT 202 10/03/2013   Lab Results  Component Value Date   CREATININE 1.07 10/03/2013   BUN 16 10/03/2013   NA 142 10/03/2013   K 4.4  10/03/2013   CL 106 10/03/2013   CO2 24 10/03/2013    No results found for this basename: HGBA1C   Lipid Panel  No results found for this basename: chol, trig, hdl, cholhdl, vldl, ldlcalc       Assessment and plan:   Patient Active Problem List   Diagnosis Date Noted  . Other malaise and fatigue 10/08/2013    Priority: Medium - Order placed for blood work, A1c, TSH, testosterone, CMP and CBC  - Patient instructed to come back within 2 weeks and we will call him back for any abnormal results.

## 2013-10-08 NOTE — Patient Instructions (Signed)

## 2013-10-08 NOTE — Progress Notes (Signed)
Pt here to establish care for sx dizziness,sinus congestion with headaches. Pt was seen in ER 6/17 with negative CT scan and orthostats. Pt states he was in prison for 20 years with intermit fatigue, dizziness Denies n/v/syncope. vss No medical hx noted

## 2013-10-09 LAB — TESTOSTERONE: Testosterone: 281 ng/dL — ABNORMAL LOW (ref 300–890)

## 2013-10-17 NOTE — Telephone Encounter (Signed)
Encounter complete. 

## 2013-10-23 ENCOUNTER — Encounter: Payer: Self-pay | Admitting: Internal Medicine

## 2013-10-23 ENCOUNTER — Ambulatory Visit: Payer: Self-pay | Attending: Internal Medicine | Admitting: Internal Medicine

## 2013-10-23 VITALS — BP 115/83 | HR 86 | Temp 98.5°F | Resp 17 | Wt 189.2 lb

## 2013-10-23 DIAGNOSIS — R5381 Other malaise: Secondary | ICD-10-CM | POA: Insufficient documentation

## 2013-10-23 DIAGNOSIS — R7989 Other specified abnormal findings of blood chemistry: Secondary | ICD-10-CM | POA: Insufficient documentation

## 2013-10-23 DIAGNOSIS — R5383 Other fatigue: Principal | ICD-10-CM

## 2013-10-23 DIAGNOSIS — Z87891 Personal history of nicotine dependence: Secondary | ICD-10-CM | POA: Insufficient documentation

## 2013-10-23 DIAGNOSIS — E291 Testicular hypofunction: Secondary | ICD-10-CM

## 2013-10-23 DIAGNOSIS — Z139 Encounter for screening, unspecified: Secondary | ICD-10-CM

## 2013-10-23 NOTE — Progress Notes (Signed)
Patient complains of having no energy States has no interest in doing anything Having trouble staying awake on the job

## 2013-10-23 NOTE — Progress Notes (Signed)
MRN: [id number] Name: Garrett Dudley  Sex: male Age: 38 y.o. DOB: [birth year]  Allergies: Review of patient's allergies indicates no known allergies.  Chief Complaint  Patient presents with  . fatiqued    HPI: Patient is 38 y.o. male who reported to have lack of energy feeling tired fatigued loss of libido for the last few months as per patient it is affecting his  relationship with his girlfriend and affecting his job because he always feels tired and sleepy, he had a blood work done which was reviewed with the patient is most of blood work is normal including CBC CMP LFTs lipid panel A1c, he has borderline low testosterone, patient denies any family history of prostate cancer. Patient denies any chest and shortness of breath had exercise tolerance test done in April 2015 which was reported to be normal.  History reviewed. No pertinent past medical history.  Past Surgical History  Procedure Laterality Date  . Eye surgery        Medication List       This list is accurate as of: 10/23/13  2:44 PM.  Always use your most recent med list.               ALLERGY PO  Take by mouth as directed.     cetirizine-pseudoephedrine 5-120 MG per tablet  Commonly known as:  ZYRTEC-D  Take 1 tablet by mouth 2 (two) times daily.     fluticasone 50 MCG/ACT nasal spray  Commonly known as:  FLONASE  Place 1 spray into both nostrils daily.        No orders of the defined types were placed in this encounter.     There is no immunization history on file for this patient.  History reviewed. No pertinent family history.  History  Substance Use Topics  . Smoking status: Former Games developermoker  . Smokeless tobacco: Not on file  . Alcohol Use: No    Review of Systems   As noted in HPI  Filed Vitals:   10/23/13 1411  BP: 115/83  Pulse: 86  Temp: 98.5 F (36.9 C)  Resp: 17    Physical Exam  Physical Exam  Constitutional: No distress.  Eyes: EOM are normal. Pupils are equal,  round, and reactive to light.  Cardiovascular: Normal rate and regular rhythm.   Pulmonary/Chest: Breath sounds normal. No respiratory distress. He has no wheezes. He has no rales.  Abdominal: Soft. There is no tenderness.  Musculoskeletal: He exhibits no edema.    CBC    Component Value Date/Time   WBC 5.1 10/08/2013 0948   RBC 4.46 10/08/2013 0948   HGB 13.5 10/08/2013 0948   HCT 40.0 10/08/2013 0948   PLT 203 10/08/2013 0948   MCV 89.7 10/08/2013 0948   LYMPHSABS 1.7 10/08/2013 0948   MONOABS 0.4 10/08/2013 0948   EOSABS 0.6 10/08/2013 0948   BASOSABS 0.1 10/08/2013 0948    CMP     Component Value Date/Time   NA 138 10/08/2013 0948   K 4.5 10/08/2013 0948   CL 103 10/08/2013 0948   CO2 27 10/08/2013 0948   GLUCOSE 85 10/08/2013 0948   BUN 15 10/08/2013 0948   CREATININE 1.14 10/08/2013 0948   CREATININE 1.07 10/03/2013 1600   CALCIUM 9.2 10/08/2013 0948   PROT 6.5 10/08/2013 0948   ALBUMIN 4.3 10/08/2013 0948   AST 31 10/08/2013 0948   ALT 26 10/08/2013 0948   ALKPHOS 49 10/08/2013 0948   BILITOT 0.5 10/08/2013 [id number]  GFRNONAA 80 10/08/2013 0948   GFRNONAA 85* 10/03/2013 1600   GFRAA >89 10/08/2013 0948   GFRAA >90 10/03/2013 1600    Lab Results  Component Value Date/Time   CHOL 151 10/08/2013  9:48 AM    No components found with this basename: hga1c    Lab Results  Component Value Date/Time   AST 31 10/08/2013  9:48 AM    Assessment and Plan  Other malaise and fatigue  Low serum testosterone - Plan: I have discussed with patient regarding testosterone therapy as well as the side effects, as per patient it is affecting his life he wants to try treatment, patient denies any family history of prostate cancer we'll check his PSA level, if it comes back normal will start patient on testosterone IM shots, with  followup level in 3 months also will repeat his PSA CBC and LFTs at that time.  Screening - Plan: PSA   Return in about 3 months (around 01/23/2014) for low  testosterone.  Doris CheadleADVANI, Deryl Ports, MD

## 2013-10-24 ENCOUNTER — Other Ambulatory Visit: Payer: Self-pay | Admitting: Internal Medicine

## 2013-10-24 ENCOUNTER — Telehealth: Payer: Self-pay

## 2013-10-24 LAB — PSA: PSA: 0.62 ng/mL (ref <=4.00)

## 2013-10-24 MED ORDER — TESTOSTERONE CYPIONATE 100 MG/ML IM SOLN: 100.0000 mg | INTRAMUSCULAR | Status: DC

## 2013-10-24 NOTE — Telephone Encounter (Signed)
Patient not available Message left of voice mail about his lab results were normal And he can pick up prescription at our office

## 2013-10-24 NOTE — Telephone Encounter (Signed)
Message copied by Lestine MountJUAREZ, Domenique Southers L on Wed Oct 24, 2013 12:05 PM ------      Message from: Doris CheadleADVANI, DEEPAK      Created: Wed Oct 24, 2013  9:35 AM       Call and let the patient know that his PSA level is normal. ------

## 2014-01-22 ENCOUNTER — Ambulatory Visit: Payer: Self-pay | Admitting: Internal Medicine

## 2014-02-05 ENCOUNTER — Encounter: Payer: Self-pay | Admitting: Internal Medicine

## 2014-02-05 ENCOUNTER — Ambulatory Visit: Payer: Self-pay | Attending: Internal Medicine | Admitting: Internal Medicine

## 2014-02-05 VITALS — BP 138/85 | HR 59 | Temp 98.3°F | Resp 17 | Wt 190.4 lb

## 2014-02-05 DIAGNOSIS — R5383 Other fatigue: Secondary | ICD-10-CM | POA: Insufficient documentation

## 2014-02-05 DIAGNOSIS — R7989 Other specified abnormal findings of blood chemistry: Secondary | ICD-10-CM

## 2014-02-05 DIAGNOSIS — Z87891 Personal history of nicotine dependence: Secondary | ICD-10-CM | POA: Insufficient documentation

## 2014-02-05 DIAGNOSIS — Z2821 Immunization not carried out because of patient refusal: Secondary | ICD-10-CM | POA: Insufficient documentation

## 2014-02-05 DIAGNOSIS — E291 Testicular hypofunction: Secondary | ICD-10-CM | POA: Insufficient documentation

## 2014-02-05 MED ORDER — TESTOSTERONE CYPIONATE 100 MG/ML IM SOLN: 100.0000 mg | INTRAMUSCULAR | Status: DC

## 2014-02-05 NOTE — Progress Notes (Signed)
MRN: [id number] Name: Garrett BullocksBrian Dudley  Sex: male Age: 38 y.o. DOB: [birth year]  Allergies: Review of patient's allergies indicates no known allergies.  Chief Complaint  Patient presents with  . Follow-up    HPI: Patient is 38 y.o. male who has history of malaise/fatigue, low testosterone level, last visit patient was prescribed her testosterone injection, as per patient he never got the prescription filled and is a new prescription, denies any change in symptoms, patient denies smoking cigarettes, his last blood work done showed normal PSA level.   History reviewed. No pertinent past medical history.  Past Surgical History  Procedure Laterality Date  . Eye surgery        Medication List       This list is accurate as of: 02/05/14 11:37 AM.  Always use your most recent med list.               ALLERGY PO  Take by mouth as directed.     cetirizine-pseudoephedrine 5-120 MG per tablet  Commonly known as:  ZYRTEC-D  Take 1 tablet by mouth 2 (two) times daily.     fluticasone 50 MCG/ACT nasal spray  Commonly known as:  FLONASE  Place 1 spray into both nostrils daily.     testosterone cypionate 100 MG/ML injection  Commonly known as:  DEPO-TESTOSTERONE  Inject 1 mL (100 mg total) into the muscle every 14 (fourteen) days. For IM use only        Meds ordered this encounter  Medications  . testosterone cypionate (DEPO-TESTOSTERONE) 100 MG/ML injection    Sig: Inject 1 mL (100 mg total) into the muscle every 14 (fourteen) days. For IM use only    Dispense:  10 mL    Refill:  0     There is no immunization history on file for this patient.  History reviewed. No pertinent family history.  History  Substance Use Topics  . Smoking status: Former Games developermoker  . Smokeless tobacco: Not on file  . Alcohol Use: No    Review of Systems   As noted in HPI  Filed Vitals:   02/05/14 1051  BP: 138/85  Pulse: 59  Temp: 98.3 F (36.8 C)  Resp: 17    Physical  Exam  Physical Exam  Constitutional: No distress.  Eyes: EOM are normal. Pupils are equal, round, and reactive to light.  Cardiovascular: Normal rate and regular rhythm.   Pulmonary/Chest: Breath sounds normal. No respiratory distress. He has no wheezes. He has no rales.  Musculoskeletal: He exhibits no edema.    CBC    Component Value Date/Time   WBC 5.1 10/08/2013 0948   RBC 4.46 10/08/2013 0948   HGB 13.5 10/08/2013 0948   HCT 40.0 10/08/2013 0948   PLT 203 10/08/2013 0948   MCV 89.7 10/08/2013 0948   LYMPHSABS 1.7 10/08/2013 0948   MONOABS 0.4 10/08/2013 0948   EOSABS 0.6 10/08/2013 0948   BASOSABS 0.1 10/08/2013 0948    CMP     Component Value Date/Time   NA 138 10/08/2013 0948   K 4.5 10/08/2013 0948   CL 103 10/08/2013 0948   CO2 27 10/08/2013 0948   GLUCOSE 85 10/08/2013 0948   BUN 15 10/08/2013 0948   CREATININE 1.14 10/08/2013 0948   CREATININE 1.07 10/03/2013 1600   CALCIUM 9.2 10/08/2013 0948   PROT 6.5 10/08/2013 0948   ALBUMIN 4.3 10/08/2013 0948   AST 31 10/08/2013 0948   ALT 26 10/08/2013 0948   ALKPHOS  49 10/08/2013 0948   BILITOT 0.5 10/08/2013 0948   GFRNONAA 80 10/08/2013 0948   GFRNONAA 85* 10/03/2013 1600   GFRAA >89 10/08/2013 0948   GFRAA >90 10/03/2013 1600    Lab Results  Component Value Date/Time   CHOL 151 10/08/2013  9:48 AM    No components found with this basename: hga1c    Lab Results  Component Value Date/Time   AST 31 10/08/2013  9:48 AM    Assessment and Plan  Other fatigue/Low serum testosterone - Plan: Prescription for testosterone cypionate (DEPO-TESTOSTERONE) 100 MG/ML injection every 2 weeks, patient will follow up in 3 months, will check CBC LFT and PSA level at that time.   Health Maintenance Patient declines flu shot   Return in about 3 months (around 05/08/2014).  Doris CheadleADVANI, Shermeka Rutt, MD

## 2014-02-05 NOTE — Progress Notes (Signed)
Patient here for his three month follow up Patient did mention he was supposed to get a testosterone injection today Patient does not have any medication with him

## 2014-03-04 ENCOUNTER — Ambulatory Visit: Payer: Self-pay

## 2014-06-28 ENCOUNTER — Encounter (HOSPITAL_COMMUNITY): Payer: Self-pay | Admitting: Emergency Medicine

## 2014-06-28 ENCOUNTER — Emergency Department (HOSPITAL_COMMUNITY)
Admission: EM | Admit: 2014-06-28 | Discharge: 2014-06-28 | Disposition: A | Discharge status: Home / Self Care | Payer: Self-pay | Attending: Emergency Medicine | Admitting: Emergency Medicine

## 2014-06-28 DIAGNOSIS — Z79899 Other long term (current) drug therapy: Secondary | ICD-10-CM | POA: Insufficient documentation

## 2014-06-28 DIAGNOSIS — Z23 Encounter for immunization: Secondary | ICD-10-CM | POA: Insufficient documentation

## 2014-06-28 DIAGNOSIS — Y9389 Activity, other specified: Secondary | ICD-10-CM | POA: Insufficient documentation

## 2014-06-28 DIAGNOSIS — Y9289 Other specified places as the place of occurrence of the external cause: Secondary | ICD-10-CM | POA: Insufficient documentation

## 2014-06-28 DIAGNOSIS — W228XXA Striking against or struck by other objects, initial encounter: Secondary | ICD-10-CM | POA: Insufficient documentation

## 2014-06-28 DIAGNOSIS — Y990 Civilian activity done for income or pay: Secondary | ICD-10-CM | POA: Insufficient documentation

## 2014-06-28 DIAGNOSIS — Z7951 Long term (current) use of inhaled steroids: Secondary | ICD-10-CM | POA: Insufficient documentation

## 2014-06-28 DIAGNOSIS — S01111A Laceration without foreign body of right eyelid and periocular area, initial encounter: Secondary | ICD-10-CM | POA: Insufficient documentation

## 2014-06-28 DIAGNOSIS — Z87891 Personal history of nicotine dependence: Secondary | ICD-10-CM | POA: Insufficient documentation

## 2014-06-28 MED ORDER — TETANUS-DIPHTH-ACELL PERTUSSIS 5-2.5-18.5 LF-MCG/0.5 IM SUSP
0.5000 mL | Freq: Once | INTRAMUSCULAR | Status: AC
  Administered 2014-06-28: 0.5 mL via INTRAMUSCULAR
  Filled 2014-06-28: qty 0.5

## 2014-06-28 NOTE — ED Notes (Signed)
Pt st's he was at work and was pushing something outside when he bounced back and hit him in the left eyebrow.   Pt has sm lac to right eyebrow.  No bleeding at this time

## 2014-06-28 NOTE — ED Notes (Signed)
Pt reports pushing a cart at work when a piece of metal flew up and hit him in his R eyebrow. Small, ~2 cm, lac noted. No bleeding at this time.

## 2014-06-28 NOTE — ED Provider Notes (Signed)
CSN: [id number]     Arrival date & time 06/28/14  1519 History   First MD Initiated Contact with Patient 06/28/14 1639     Chief Complaint  Patient presents with  . Facial Laceration     (Consider location/radiation/quality/duration/timing/severity/associated sxs/prior Treatment) HPI Patient presents to the emergency department with a small laceration in the lateral aspect of his right eyebrow.  The patient states that he struck his eye against a cart at work.  Patient states that he applied pressure to stop the bleeding.  Patient's not complaining of any blurred vision or headache.  Patient did not take any medications prior to arrival History reviewed. No pertinent past medical history. Past Surgical History  Procedure Laterality Date  . Eye surgery     No family history on file. History  Substance Use Topics  . Smoking status: Former Games developermoker  . Smokeless tobacco: Not on file  . Alcohol Use: No    Review of Systems  All other systems negative except as documented in the HPI. All pertinent positives and negatives as reviewed in the HPI.   Allergies  Review of patient's allergies indicates no known allergies.  Home Medications   Prior to Admission medications   Medication Sig Start Date End Date Taking? Authorizing Provider  cetirizine-pseudoephedrine (ZYRTEC-D) 5-120 MG per tablet Take 1 tablet by mouth 2 (two) times daily. 10/03/13   Ivonne AndrewPeter Dammen, PA-C  Chlorpheniramine Maleate (ALLERGY PO) Take by mouth as directed.    Historical Provider, MD  fluticasone (FLONASE) 50 MCG/ACT nasal spray Place 1 spray into both nostrils daily. 10/03/13   Ivonne AndrewPeter Dammen, PA-C  testosterone cypionate (DEPO-TESTOSTERONE) 100 MG/ML injection Inject 1 mL (100 mg total) into the muscle every 14 (fourteen) days. For IM use only 02/05/14   Deepak Advani, MD   BP 136/75 mmHg  Pulse 83  Temp(Src) 98.8 F (37.1 C) (Oral)  Resp 16  Ht 5\' 11"  (1.803 m)  Wt 200 lb (90.719 kg)  BMI 27.91 kg/m2  SpO2  95% Physical Exam  Constitutional: He is oriented to person, place, and time.  HENT:  Head: Normocephalic.    Eyes: Pupils are equal, round, and reactive to light.  Pulmonary/Chest: Effort normal.  Neurological: He is alert and oriented to person, place, and time.  Skin: Skin is warm and dry.  Nursing note and vitals reviewed.   ED Course  Procedures (including critical care time) LACERATION REPAIR Performed by: Carlyle DollyLAWYER,Omie Ferger W Authorized by: Carlyle DollyLAWYER,Amarion Portell W Consent: Verbal consent obtained. Risks and benefits: risks, benefits and alternatives were discussed Consent given by: patient Patient identity confirmed: provided demographic data Prepped and Draped in normal sterile fashion Wound explored  Laceration Location: Right eyebrow  Laceration Length: 1 cm  No Foreign Bodies seen or palpated  Anesthesia: local infiltration  Local anesthetic: Not applicable Anesthetic total: Not applicable Irrigation method: syringe Amount of cleaning: standard  Skin closure: Dermabond   Number of sutures: Dermabond   Technique: Dermabond   Patient tolerance: Patient tolerated the procedure well with no immediate complications.   Patient is advised to return here as needed.  I also advised him to not scrub the area and that the Dermabond will fall off since 10-14 days.    Charlestine NightChristopher Andrian Urbach, PA-C 06/28/14 1709  Doug SouSam Jacubowitz, MD 06/29/14 [phone]

## 2014-06-28 NOTE — Discharge Instructions (Signed)
Return here as needed.  Follow-up with your primary care doctor.  Do not scrub the area as it sealed and does not need any cleaning or ointments

## 2014-11-14 ENCOUNTER — Telehealth: Payer: Self-pay | Admitting: Internal Medicine

## 2014-11-14 NOTE — Telephone Encounter (Signed)
Patient called stating that he has a rash on his body that it itches and he feels like something is biting him. He would like some advice on what to do next. Please f/u with pt.

## 2014-11-20 ENCOUNTER — Ambulatory Visit: Payer: Self-pay | Attending: Internal Medicine | Admitting: Internal Medicine

## 2014-11-20 ENCOUNTER — Encounter: Payer: Self-pay | Admitting: Internal Medicine

## 2014-11-20 VITALS — BP 126/73 | HR 76 | Temp 98.0°F | Resp 16 | Wt 223.4 lb

## 2014-11-20 DIAGNOSIS — L299 Pruritus, unspecified: Secondary | ICD-10-CM | POA: Insufficient documentation

## 2014-11-20 DIAGNOSIS — Z79899 Other long term (current) drug therapy: Secondary | ICD-10-CM | POA: Insufficient documentation

## 2014-11-20 DIAGNOSIS — Z87891 Personal history of nicotine dependence: Secondary | ICD-10-CM | POA: Insufficient documentation

## 2014-11-20 MED ORDER — PERMETHRIN 5 % EX CREA: 1.0000 "application " | TOPICAL_CREAM | Freq: Once | CUTANEOUS | Status: DC

## 2014-11-20 MED ORDER — METHYLPREDNISOLONE 4 MG PO TBPK: ORAL_TABLET | ORAL | Status: DC

## 2014-11-20 NOTE — Progress Notes (Signed)
MRN: [id number] Name: Teron Blais  Sex: male Age: 39 y.o. DOB: 01/05/1976  Allergies: Review of patient's allergies indicates no known allergies.  Chief Complaint  Patient presents with  . Rash    HPI: Patient is 39 y.o. male who comes today complaining of severe itching in his whole body for the last 3-4 weeks denies any recent travel, denies any rash, denies any change in soap, detergent, denies fever chills headache dizziness  or shortness of breath , as per patient he is tried over-the-counter hydrocortisone cream without much improvement, patient denies any other family member having similar symptoms the itching is worse throughout the day, as per patient he has used permethrin cream in the past which helped him with the similar symptoms.  History reviewed. No pertinent past medical history.  Past Surgical History  Procedure Laterality Date  . Eye surgery        Medication List       This list is accurate as of: 11/20/14 11:11 AM.  Always use your most recent med list.               ALLERGY PO  Take by mouth as directed.     cetirizine-pseudoephedrine 5-120 MG per tablet  Commonly known as:  ZYRTEC-D  Take 1 tablet by mouth 2 (two) times daily.     fluticasone 50 MCG/ACT nasal spray  Commonly known as:  FLONASE  Place 1 spray into both nostrils daily.     methylPREDNISolone 4 MG Tbpk tablet  Commonly known as:  MEDROL DOSEPAK  follow package directions     permethrin 5 % cream  Commonly known as:  ACTICIN  Apply 1 application topically once. Apply 5% cream to entire body from the neck down, then wash off after 8 to 14 hours     testosterone cypionate 100 MG/ML injection  Commonly known as:  DEPO-TESTOSTERONE  Inject 1 mL (100 mg total) into the muscle every 14 (fourteen) days. For IM use only        Meds ordered this encounter  Medications  . permethrin (ACTICIN) 5 % cream    Sig: Apply 1 application topically once. Apply 5% cream to entire body  from the neck down, then wash off after 8 to 14 hours    Dispense:  60 g    Refill:  1  . methylPREDNISolone (MEDROL DOSEPAK) 4 MG TBPK tablet    Sig: follow package directions    Dispense:  21 tablet    Refill:  0    Immunization History  Administered Date(s) Administered  . Tdap 06/28/2014    History reviewed. No pertinent family history.  History  Substance Use Topics  . Smoking status: Former Games developer  . Smokeless tobacco: Not on file  . Alcohol Use: No    Review of Systems   As noted in HPI  Filed Vitals:   11/20/14 1054  BP: 126/73  Pulse: 76  Temp: 98 F (36.7 C)  Resp: 16    Physical Exam  Physical Exam  Constitutional: No distress.  Eyes: EOM are normal. Pupils are equal, round, and reactive to light.  Cardiovascular: Normal rate and regular rhythm.   Pulmonary/Chest: Breath sounds normal. No respiratory distress. He has no wheezes. He has no rales.  Musculoskeletal:  Bilateral hands no visible rash in the interdigital space.    Labs   Lab Results  Component Value Date   WBC 5.1 10/08/2013   HGB 13.5 10/08/2013   HCT 40.0  10/08/2013   PLT 203 10/08/2013   GLUCOSE 85 10/08/2013   CHOL 151 10/08/2013   TRIG 139 10/08/2013   HDL 50 10/08/2013   LDLCALC 73 10/08/2013   ALT 26 10/08/2013   AST 31 10/08/2013   NA 138 10/08/2013   K 4.5 10/08/2013   CL 103 10/08/2013   CREATININE 1.14 10/08/2013   BUN 15 10/08/2013   CO2 27 10/08/2013   TSH 2.277 10/08/2013   PSA 0.62 10/23/2013   HGBA1C 4.6 10/08/2013    Lab Results  Component Value Date   HGBA1C 4.6 10/08/2013     Assessment and Plan  Itching - Plan: patient is advised to use fragrance free soap, detergent, we'll treat him with  permethrin (ACTICIN) 5 % cream, also prescribed methylPREDNISolone (MEDROL DOSEPAK) 4 MG TBPK tablet. Patient can also take over-the-counter  Zyrtec     Return in about 3 months (around 02/20/2015), or if symptoms worsen or fail to improve.   This note  has been created with Education officer, environmental. Any transcriptional errors are unintentional.    Doris Cheadle, MD

## 2014-11-20 NOTE — Progress Notes (Signed)
Patient complains of having a rash or bug bites to his torso and behind His knees Patient states he scratches them until they form sores States very itchy Patient has tried cortisone cream with not much relief

## 2015-07-18 ENCOUNTER — Emergency Department (HOSPITAL_COMMUNITY): Payer: Self-pay

## 2015-07-18 ENCOUNTER — Encounter (HOSPITAL_COMMUNITY): Payer: Self-pay | Admitting: Family Medicine

## 2015-07-18 ENCOUNTER — Emergency Department (HOSPITAL_COMMUNITY)
Admission: EM | Admit: 2015-07-18 | Discharge: 2015-07-18 | Disposition: A | Discharge status: Home / Self Care | Payer: Self-pay | Attending: Emergency Medicine | Admitting: Emergency Medicine

## 2015-07-18 DIAGNOSIS — F419 Anxiety disorder, unspecified: Secondary | ICD-10-CM | POA: Insufficient documentation

## 2015-07-18 DIAGNOSIS — R42 Dizziness and giddiness: Secondary | ICD-10-CM | POA: Insufficient documentation

## 2015-07-18 DIAGNOSIS — R079 Chest pain, unspecified: Secondary | ICD-10-CM | POA: Insufficient documentation

## 2015-07-18 DIAGNOSIS — R531 Weakness: Secondary | ICD-10-CM | POA: Insufficient documentation

## 2015-07-18 LAB — BASIC METABOLIC PANEL
ANION GAP: 10 (ref 5–15)
BUN: 12 mg/dL (ref 6–20)
CALCIUM: 9.6 mg/dL (ref 8.9–10.3)
CO2: 22 mmol/L (ref 22–32)
CREATININE: 1.22 mg/dL (ref 0.61–1.24)
Chloride: 107 mmol/L (ref 101–111)
GFR calc Af Amer: >60 mL/min (ref >60)
GLUCOSE: 100 mg/dL — AB (ref 65–99)
Potassium: 4.1 mmol/L (ref 3.5–5.1)
Sodium: 139 mmol/L (ref 135–145)

## 2015-07-18 LAB — CBC
HCT: 40.1 % (ref 39.0–52.0)
HEMOGLOBIN: 13.2 g/dL (ref 13.0–17.0)
MCH: 30 pg (ref 26.0–34.0)
MCHC: 32.9 g/dL (ref 30.0–36.0)
MCV: 91.1 fL (ref 78.0–100.0)
PLATELETS: 219 10*3/uL (ref 150–400)
RBC: 4.4 MIL/uL (ref 4.22–5.81)
RDW: 12.4 % (ref 11.5–15.5)
WBC: 5.6 10*3/uL (ref 4.0–10.5)

## 2015-07-18 LAB — I-STAT TROPONIN, ED: TROPONIN I, POC: 0 ng/mL (ref 0.00–0.08)

## 2015-07-18 NOTE — ED Notes (Signed)
Pt sts he was at work and had sudden onset of chest pain and left arm weakness. Denise strenuous work. sts pain is gone but weakness in arm. sts some anxiety and dizziness.

## 2015-07-18 NOTE — ED Notes (Signed)
Pt states that he feels better and doesn't want to wait any longer pt encouraged to come back if symptoms get worse

## 2016-02-24 ENCOUNTER — Emergency Department (HOSPITAL_COMMUNITY)
Admission: EM | Admit: 2016-02-24 | Discharge: 2016-02-24 | Disposition: A | Discharge status: Home / Self Care | Payer: Self-pay | Attending: Emergency Medicine | Admitting: Emergency Medicine

## 2016-02-24 ENCOUNTER — Emergency Department (HOSPITAL_COMMUNITY): Payer: Self-pay

## 2016-02-24 ENCOUNTER — Encounter (HOSPITAL_COMMUNITY): Payer: Self-pay | Admitting: *Deleted

## 2016-02-24 DIAGNOSIS — W268XXA Contact with other sharp object(s), not elsewhere classified, initial encounter: Secondary | ICD-10-CM | POA: Insufficient documentation

## 2016-02-24 DIAGNOSIS — Y990 Civilian activity done for income or pay: Secondary | ICD-10-CM | POA: Insufficient documentation

## 2016-02-24 DIAGNOSIS — Z23 Encounter for immunization: Secondary | ICD-10-CM | POA: Insufficient documentation

## 2016-02-24 DIAGNOSIS — Z87891 Personal history of nicotine dependence: Secondary | ICD-10-CM | POA: Insufficient documentation

## 2016-02-24 DIAGNOSIS — Y939 Activity, unspecified: Secondary | ICD-10-CM | POA: Insufficient documentation

## 2016-02-24 DIAGNOSIS — S61411A Laceration without foreign body of right hand, initial encounter: Secondary | ICD-10-CM | POA: Insufficient documentation

## 2016-02-24 DIAGNOSIS — Y929 Unspecified place or not applicable: Secondary | ICD-10-CM | POA: Insufficient documentation

## 2016-02-24 MED ORDER — CEPHALEXIN 500 MG PO CAPS: 500.0000 mg | ORAL_CAPSULE | Freq: Four times a day (QID) | ORAL | 0 refills | Status: DC

## 2016-02-24 MED ORDER — BACITRACIN ZINC 500 UNIT/GM EX OINT: TOPICAL_OINTMENT | Freq: Two times a day (BID) | CUTANEOUS | Status: DC

## 2016-02-24 MED ORDER — NAPROXEN 500 MG PO TABS: 500.0000 mg | ORAL_TABLET | Freq: Two times a day (BID) | ORAL | 0 refills | Status: DC

## 2016-02-24 MED ORDER — TETANUS-DIPHTH-ACELL PERTUSSIS 5-2.5-18.5 LF-MCG/0.5 IM SUSP
0.5000 mL | Freq: Once | INTRAMUSCULAR | Status: AC
  Administered 2016-02-24: 0.5 mL via INTRAMUSCULAR
  Filled 2016-02-24: qty 0.5

## 2016-02-24 NOTE — ED Notes (Signed)
Declined W/C at D/C and was escorted to lobby by RN. 

## 2016-02-24 NOTE — ED Notes (Signed)
Patient transported to X-ray 

## 2016-02-24 NOTE — Discharge Instructions (Signed)
Call Dr. Carlos LeveringGramig's office in the morning to schedule recheck of the wounds. Take the antibiotics as directed. Return here as needed.

## 2016-02-24 NOTE — ED Notes (Signed)
Soaked right hand in saline, waiting on x ray.

## 2016-02-24 NOTE — ED Triage Notes (Signed)
Pt report the he got his had caught on a piece of metal pt has skin tears to rt hand. Bleeding controled.

## 2016-02-24 NOTE — ED Provider Notes (Signed)
[facility]-EMERGENCY DEPT Provider Note   CSN: [id number] Arrival date & time: 02/24/16  1600  By signing my name below, I, Garrett Dudley, attest that this documentation has been prepared under the direction and in the presence of [facility], [place].  Electronically Signed: Rosario Dudley, ED Scribe. 02/24/16. 4:49 PM.  History   Chief Complaint Chief Complaint  Patient presents with  . Extremity Laceration   The history is provided by the patient. No language interpreter was used.  Laceration   The incident occurred less than 1 hour ago. The laceration is located on the right hand. The laceration mechanism was a a metal edge. The pain is at a severity of 7/10. The pain is moderate. The pain has been constant since onset. His tetanus status is out of date.    Garrett Dudley is a 40 y.o. male who presents to the Emergency Department complaining of a laceration to the right hand that occurred just prior to arrival.. Bleeding is controlled with pressure dressing. Pt reports that he was working with a steel beam at work when a metal edge came across his hand, sustaining his current wound. He applied neosporin to the area prior to coming into the ED, however, no other treatments were tried. His pain is exacerbated with movement of his digits. Denies numbness, or any other associated symptoms. Tetanus is not UTD.   History reviewed. No pertinent past medical history.  Patient Active Problem List   Diagnosis Date Noted  . 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 Other malaise and fatigue 10/08/2013   Past Surgical History:  Procedure Laterality Date  . EYE SURGERY      Home Medications    Prior to Admission medications   Medication Sig Start Date End Date Taking? Authorizing Provider  cephALEXin (KEFLEX) 500 MG capsule Take 1 capsule (500 mg total) by mouth 4 (four) times daily. 02/24/16   Garrett Witcher Orlene Och, NP  cetirizine-pseudoephedrine (ZYRTEC-D) 5-120 MG per tablet Take 1 tablet by mouth  2 (two) times daily. 10/03/13   Garrett Dudley  Garrett Dudley (ALLERGY PO) Take by mouth as directed.    Historical Provider, Dudley  fluticasone (FLONASE) 50 MCG/ACT nasal spray Place 1 spray into both nostrils daily. 10/03/13   Garrett Dudley  methylPREDNISolone (MEDROL DOSEPAK) 4 MG TBPK tablet follow package directions 11/20/14   Garrett Dudley  naproxen (NAPROSYN) 500 MG tablet Take 1 tablet (500 mg total) by mouth 2 (two) times daily. 02/24/16   Garrett Philbin Orlene Och, NP  permethrin (ACTICIN) 5 % cream Apply 1 application topically once. Apply 5% cream to entire body from the neck down, then wash off after 8 to 14 hours 11/20/14   Garrett Dudley  testosterone cypionate (DEPO-TESTOSTERONE) 100 MG/ML injection Inject 1 mL (100 mg total) into the muscle every 14 (fourteen) days. For IM use only 02/05/14   Garrett Dudley    Family History No family history on file.  Social History Social History  Substance Use Topics  . Smoking status: Former Games developer  . Smokeless tobacco: Never Used  . Alcohol use No   Allergies   Patient has no known allergies.  Review of Systems Review of Systems  Musculoskeletal: Positive for arthralgias.       Pain right hand  Skin: Positive for wound.  Neurological: Negative for numbness.  All other systems reviewed and are negative.  Physical Exam Updated Vital Signs BP 158/78 (BP Location: Left Arm)   Pulse 86  Temp 98.1 F (36.7 C) (Oral)   Resp 18   SpO2 98%   Physical Exam  Constitutional: He appears well-developed and well-nourished.  HENT:  Head: Normocephalic.  Eyes: Conjunctivae are normal.  Cardiovascular: Normal rate.   Pulmonary/Chest: Effort normal. No respiratory distress.  Abdominal: He exhibits no distension.  Musculoskeletal:  Swelling to the dorsum of the right hand and to the little finger. There is a avulsion laceration to the radial aspect of the right hand at the base of the thumb extending to the wrist. He has a  similar laceration to the ulnar aspect of the right hand. Bleeding is controlled. The flaps are superficial and do not require sutures.  Neurological: He is alert.  Skin: Skin is warm and dry.  Psychiatric: He has a normal mood and affect. His behavior is normal.  Nursing note and vitals reviewed.  ED Treatments / Results  DIAGNOSTIC STUDIES: Oxygen Saturation is 98% on RA, normal by my interpretation.   COORDINATION OF CARE: 4:47 PM-Discussed next steps with pt. Pt verbalized understanding and is agreeable with the plan.   Radiology Dg Hand Complete Right  Result Date: 02/24/2016 CLINICAL DATA:  swelling, lateral and medial right hand lacerations s/p injury at work. 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 Pt states he is unable to move his 5th finger on the right hand due to pain. EXAM: RIGHT HAND - COMPLETE 3+ VIEW COMPARISON:  None. FINDINGS: There is no evidence of fracture or dislocation. There is no evidence of arthropathy or other focal bone abnormality. Soft tissues are unremarkable. IMPRESSION: Negative. Electronically Signed   By: Corlis Leak  Hassell M.D.   On: 02/24/2016 17:19   Procedures  Procedures   Medications Ordered in ED Medications  Tdap (BOOSTRIX) injection 0.5 mL (0.5 mLs Intramuscular Given 02/24/16 1716)   Initial Impression / Assessment and Plan / ED Course  I have reviewed the triage vital signs and the nursing notes.  Pertinent imaging results that were available during my care of the patient were reviewed by me and considered in my medical decision making (see chart for details).  Clinical Course    Garrett BullocksBrian Dudley is a 40yo male who presents to ED for lacerations involving the right hand. Wound explored and bottom of wound seen in a bloodless field. Lacerations from this incident are mostly superficial and skin tear in nature, and do not  require repair at this time. Cleaned the area with Betadine and irrigated with 1000cc NSL with syringe and a splash guard. Patient counseled on home wound care. Due to MOI, pertinent XR was ordered which was negative for obvious fx, dislocation, or foreign body involvement. Patient was urged to return to the Emergency Department for worsening pain, swelling, expanding erythema especially if it streaks away from the affected area, fever, or for any additional concerns. Pt is comfortable with above plan and is stable for discharge at this time. All questions were answered prior to disposition. Strict return precautions for f/u into the ED were discussed. Will start antibiotics and give patient hand surgeon follow up.   Final Clinical Impressions(s) / ED Diagnoses   Final diagnoses:  Laceration of right hand without foreign body, initial encounter   New Prescriptions Discharge Medication List as of 02/24/2016  6:10 PM    START taking these medications   Details  cephALEXin (KEFLEX) 500 MG capsule  Take 1 capsule (500 mg total) by mouth 4 (four) times daily., Starting Tue 02/24/2016, Print    naproxen (NAPROSYN) 500 MG tablet Take 1 tablet (500 mg total) by mouth 2 (two) times daily., Starting Tue 02/24/2016, Print      I personally performed the services described in this documentation, which was scribed in my presence. The recorded information has been reviewed and is accurate.     [address]Haniel Fix [place] Channie Bostick, NP 02/27/16 1707    Rolland PorterMark James, Dudley 03/17/16 1019

## 2016-11-27 ENCOUNTER — Observation Stay (HOSPITAL_COMMUNITY)
Admission: EM | Admit: 2016-11-27 | Discharge: 2016-11-28 | Disposition: A | Discharge status: Home / Self Care | Payer: Self-pay | Attending: Family Medicine | Admitting: Family Medicine

## 2016-11-27 ENCOUNTER — Emergency Department (HOSPITAL_COMMUNITY): Payer: Self-pay

## 2016-11-27 ENCOUNTER — Encounter (HOSPITAL_COMMUNITY): Payer: Self-pay | Admitting: Emergency Medicine

## 2016-11-27 DIAGNOSIS — R2681 Unsteadiness on feet: Secondary | ICD-10-CM | POA: Insufficient documentation

## 2016-11-27 DIAGNOSIS — R531 Weakness: Principal | ICD-10-CM | POA: Insufficient documentation

## 2016-11-27 DIAGNOSIS — R079 Chest pain, unspecified: Secondary | ICD-10-CM

## 2016-11-27 DIAGNOSIS — R0789 Other chest pain: Secondary | ICD-10-CM | POA: Insufficient documentation

## 2016-11-27 DIAGNOSIS — Z87891 Personal history of nicotine dependence: Secondary | ICD-10-CM | POA: Insufficient documentation

## 2016-11-27 DIAGNOSIS — R29898 Other symptoms and signs involving the musculoskeletal system: Secondary | ICD-10-CM | POA: Diagnosis present

## 2016-11-27 DIAGNOSIS — Z8673 Personal history of transient ischemic attack (TIA), and cerebral infarction without residual deficits: Secondary | ICD-10-CM | POA: Insufficient documentation

## 2016-11-27 HISTORY — DX: Cerebral infarction, unspecified: I63.9

## 2016-11-27 HISTORY — DX: Cardiac murmur, unspecified: R01.1

## 2016-11-27 LAB — TROPONIN I: Troponin I: <0.03 ng/mL (ref <0.03)

## 2016-11-27 LAB — CBC
HEMATOCRIT: 42.3 % (ref 39.0–52.0)
HEMOGLOBIN: 14.1 g/dL (ref 13.0–17.0)
MCH: 31.2 pg (ref 26.0–34.0)
MCHC: 33.3 g/dL (ref 30.0–36.0)
MCV: 93.6 fL (ref 78.0–100.0)
Platelets: 227 10*3/uL (ref 150–400)
RBC: 4.52 MIL/uL (ref 4.22–5.81)
RDW: 12.5 % (ref 11.5–15.5)
WBC: 6.5 10*3/uL (ref 4.0–10.5)

## 2016-11-27 LAB — COMPREHENSIVE METABOLIC PANEL
ALBUMIN: 3.6 g/dL (ref 3.5–5.0)
ALT: 28 U/L (ref 17–63)
AST: 26 U/L (ref 15–41)
Alkaline Phosphatase: 40 U/L (ref 38–126)
Anion gap: 6 (ref 5–15)
BILIRUBIN TOTAL: 0.5 mg/dL (ref 0.3–1.2)
BUN: 13 mg/dL (ref 6–20)
CO2: 25 mmol/L (ref 22–32)
Calcium: 8.7 mg/dL — ABNORMAL LOW (ref 8.9–10.3)
Chloride: 107 mmol/L (ref 101–111)
Creatinine, Ser: 1.12 mg/dL (ref 0.61–1.24)
GFR calc Af Amer: >60 mL/min (ref >60)
GFR calc non Af Amer: >60 mL/min (ref >60)
GLUCOSE: 100 mg/dL — AB (ref 65–99)
POTASSIUM: 4.4 mmol/L (ref 3.5–5.1)
Sodium: 138 mmol/L (ref 135–145)
TOTAL PROTEIN: 6.1 g/dL — AB (ref 6.5–8.1)

## 2016-11-27 MED ORDER — ASPIRIN 325 MG PO TABS
325.0000 mg | ORAL_TABLET | Freq: Every day | ORAL | Status: DC
  Administered 2016-11-27 – 2016-11-28 (×2): 325 mg via ORAL
  Filled 2016-11-27 (×2): qty 1

## 2016-11-27 MED ORDER — ACETAMINOPHEN 650 MG RE SUPP: 650.0000 mg | RECTAL | Status: DC | PRN

## 2016-11-27 MED ORDER — FENTANYL CITRATE (PF) 100 MCG/2ML IJ SOLN
50.0000 ug | INTRAMUSCULAR | Status: DC | PRN
Start: 2016-11-27 — End: 2016-11-28
  Administered 2016-11-27 – 2016-11-28 (×5): 50 ug via INTRAVENOUS
  Filled 2016-11-27 (×5): qty 2

## 2016-11-27 MED ORDER — STROKE: EARLY STAGES OF RECOVERY BOOK
Freq: Once | Status: DC
  Filled 2016-11-27: qty 1

## 2016-11-27 MED ORDER — ENOXAPARIN SODIUM 40 MG/0.4ML ~~LOC~~ SOLN
40.0000 mg | SUBCUTANEOUS | Status: DC
  Filled 2016-11-27: qty 0.4

## 2016-11-27 MED ORDER — ACETAMINOPHEN 160 MG/5ML PO SOLN: 650.0000 mg | ORAL | Status: DC | PRN

## 2016-11-27 MED ORDER — ASPIRIN 81 MG PO CHEW
324.0000 mg | CHEWABLE_TABLET | Freq: Once | ORAL | Status: AC
  Administered 2016-11-27: 324 mg via ORAL
  Filled 2016-11-27: qty 4

## 2016-11-27 MED ORDER — SENNOSIDES-DOCUSATE SODIUM 8.6-50 MG PO TABS: 1.0000 | ORAL_TABLET | Freq: Every evening | ORAL | Status: DC | PRN

## 2016-11-27 MED ORDER — ACETAMINOPHEN 325 MG PO TABS: 650.0000 mg | ORAL_TABLET | ORAL | Status: DC | PRN

## 2016-11-27 MED ORDER — ASPIRIN 300 MG RE SUPP: 300.0000 mg | Freq: Every day | RECTAL | Status: DC

## 2016-11-27 MED ORDER — SODIUM CHLORIDE 0.9 % IV SOLN
INTRAVENOUS | Status: DC
  Administered 2016-11-27: 18:00:00 via INTRAVENOUS

## 2016-11-27 MED ORDER — IOPAMIDOL (ISOVUE-370) INJECTION 76%
100.0000 mL | Freq: Once | INTRAVENOUS | Status: AC | PRN
  Administered 2016-11-27: 100 mL via INTRAVENOUS

## 2016-11-27 NOTE — ED Triage Notes (Addendum)
Pt reports chest pain upon waking this am. Pt reports left arm pain and back pain. Severe left arm drift. Moderate left leg weakness and decreased sensation on left arm. EDP aware and at bedside assessing patient.

## 2016-11-27 NOTE — Consult Note (Signed)
Neurology Consultation  Reason for Consult: stroke  Referring Physician: Dr. Brett CanalesSteve Newton/Dr. Lyda PeroneJared Gardner - [facility]  CC: Left arm and leg weakness  History is obtained from: Patient  HPI: Garrett BullocksBrian Burgo is a 41 y.o. male who has a past medical history of? Stroke in the past, woke up with left arm and leg weakness this morning. According to him, he thinks he woke up fine but 5-10 minutes after waking up he started having discomfort in his left chest, and then tingling and numbness of his left arm followed by tingling and numbness of his left leg. He says he was in prison when he had a possible earlier stroke with similar symptoms but symptoms today are much more severe and worse. He endorses a lot of personal stressors at this time including family and financial related stressors. He denies any preceding fevers, chills, abdominal pain nausea vomiting. Denies any visual symptoms. Denies headaches. Endorses chest pain as described above. Denies palpitations or shortness of breath. He was seen at [facility], and because of the left-sided weakness and negative cardiac/PE workup sent [facility] for a stroke workup and MRI as the MRI facilities are unavailable over the weekend [facility].  Of note, I was called to evaluate the patient for left-sided weakness. When I entered the patient's room he was operating his phone with his left hand but on examination he endorsed decreased range of motion, weakness and pain in his entire left upper extremity.  LKW: 10 AM tpa given?: no, outside the window, not stroke Premorbid modified Rankin scale (mRS): 0-Completely asymptomatic and back to baseline post-stroke  ROS: A 14 point ROS was performed and is negative except as noted in the HPI.   Past Medical History:  Diagnosis Date  . Heart murmur   . Stroke [facility](HCC)    TIA 2012   History reviewed. No pertinent family history.   Social History:   reports that he has quit smoking.  He has never used smokeless tobacco. He reports that he does not drink alcohol or use drugs.   Medications  Current Facility-Administered Medications:  .   stroke: mapping our early stages of recovery book, , Does not apply, Once, Floydene FlockNewton, Steven J, MD .  0.9 %  sodium chloride infusion, , Intravenous, Continuous, Floydene FlockNewton, Steven J, MD, Last Rate: 75 mL/hr at 11/27/16 1821 .  acetaminophen (TYLENOL) tablet 650 mg, 650 mg, Oral, Q4H PRN **OR** acetaminophen (TYLENOL) solution 650 mg, 650 mg, Per Tube, Q4H PRN **OR** acetaminophen (TYLENOL) suppository 650 mg, 650 mg, Rectal, Q4H PRN, Floydene FlockNewton, Steven J, MD .  aspirin suppository 300 mg, 300 mg, Rectal, Daily **OR** aspirin tablet 325 mg, 325 mg, Oral, Daily, Floydene FlockNewton, Steven J, MD, 325 mg at 11/27/16 1807 .  enoxaparin (LOVENOX) injection 40 mg, 40 mg, Subcutaneous, Q24H, Floydene FlockNewton, Steven J, MD .  fentaNYL (SUBLIMAZE) injection 50 mcg, 50 mcg, Intravenous, Q2H PRN, Blane OharaZavitz, Joshua, MD, 50 mcg at 11/27/16 2141 .  senna-docusate (Senokot-S) tablet 1 tablet, 1 tablet, Oral, QHS PRN, Floydene FlockNewton, Steven J, MD  Exam: Current vital signs: BP 130/73 (BP Location: Right Arm)   Pulse 65   Temp 98.1 F (36.7 C) (Oral)   Resp 18   Ht 6' (1.829 m)   Wt 114 kg (251 lb 5.2 oz)   SpO2 97%   BMI 34.09 kg/m  Vital signs in last 24 hours: Temp:  [98.1 F (36.7 C)-98.9 F (37.2 C)] 98.1 F (36.7 C) (08/11 2301) Pulse Rate:  [  53-68] 65 (08/11 2301) Resp:  [16-22] 18 (08/11 2301) BP: (96-142)/(66-85) 130/73 (08/11 2301) SpO2:  [97 %-100 %] 97 % (08/11 2301) Weight:  [99.8 kg (220 lb)-114 kg (251 lb 5.2 oz)] 114 kg (251 lb 5.2 oz) (08/11 2301)  GENERAL: Awake, alert in NAD HEENT: - Normocephalic and atraumatic, dry mm, no LN++, no Thyromegally LUNGS - Clear to auscultation bilaterally with no wheezes CV - S1S2 RRR, no m/r/g, equal pulses bilaterally. ABDOMEN - Soft, nontender, nondistended with normoactive BS Ext: warm, well perfused, intact peripheral pulses,   0 edema  NEURO:  Mental Status: AA&Ox3  Language: speech is clear.  Naming, repetition, fluency, and comprehension intact. Cranial Nerves: PERRL. EOMI, visual fields full, no facial asymmetry, facial sensation intact, hearing intact, tongue/uvula/soft palate midline, normal sternocleidomastoid and trapezius muscle strength. No evidence of tongue atrophy or fibrillations Motor: Right upper and lower extremity 5/5. Left upper and lower extremity with obvious giveaway weakness and Hoover's positive on the left lower extremity. Complained of pain in the chest and back on movement of the left arm passively. Tone: is normal and bulk is normal Sensation- decreased sensation to light touch and vibration on the entire left hemibody. Splits vibratory sense by a tuning fork in the midline on his forehead. Coordination: FTN intact on the right Gait- deferred  NIHSS 1a Level of Conscious.: 0 1b LOC Questions: 0 1c LOC Commands: 0 2 Best Gaze: 0 3 Visual: 0 4 Facial Palsy: 0 5a Motor Arm - left: 1 5b Motor Arm - Right: 0 6a Motor Leg - Left: 2 6b Motor Leg - Right: 0 7 Limb Ataxia: 0 8 Sensory: 1 9 Best Language: 0 10 Dysarthria: 0 11 Extinct. and Inatten.: 0 TOTAL: 4    Labs I have reviewed labs in epic and the results pertinent to this consultation are: Mild hyperglycemia  CBC    Component Value Date/Time   WBC 6.5 11/27/2016 1320   RBC 4.52 11/27/2016 1320   HGB 14.1 11/27/2016 1320   HCT 42.3 11/27/2016 1320   PLT 227 11/27/2016 1320   MCV 93.6 11/27/2016 1320   MCH 31.2 11/27/2016 1320   MCHC 33.3 11/27/2016 1320   RDW 12.5 11/27/2016 1320   LYMPHSABS 1.7 10/08/2013 0948   MONOABS 0.4 10/08/2013 0948   EOSABS 0.6 10/08/2013 0948   BASOSABS 0.1 10/08/2013 0948    CMP     Component Value Date/Time   NA 138 11/27/2016 1320   K 4.4 11/27/2016 1320   CL 107 11/27/2016 1320   CO2 25 11/27/2016 1320   GLUCOSE 100 (H) 11/27/2016 1320   BUN 13 11/27/2016 1320   CREATININE  1.12 11/27/2016 1320   CREATININE 1.14 10/08/2013 0948   CALCIUM 8.7 (L) 11/27/2016 1320   PROT 6.1 (L) 11/27/2016 1320   ALBUMIN 3.6 11/27/2016 1320   AST 26 11/27/2016 1320   ALT 28 11/27/2016 1320   ALKPHOS 40 11/27/2016 1320   BILITOT 0.5 11/27/2016 1320   GFRNONAA >60 11/27/2016 1320   GFRNONAA 80 10/08/2013 0948   GFRAA >60 11/27/2016 1320   GFRAA >89 10/08/2013 0948    Lipid Panel     Component Value Date/Time   CHOL 151 10/08/2013 0948   TRIG 139 10/08/2013 0948   HDL 50 10/08/2013 0948   CHOLHDL 3.0 10/08/2013 0948   VLDL 28 10/08/2013 0948   LDLCALC 73 10/08/2013 0948     Imaging I have reviewed the images obtained:  CT-scan of the brain - no acute abnormality  CT angiogram of the chest negative for dissection or PE.   Assessment:  41 year old man with past medical history of questionable stroke with no residual deficits complaining of left arm and leg weakness, no facial involvement, decreased sensation on the left hemibody with splitting of vibratory sense on his forehead which is nonphysiological, and obvious give way weakness in his left arm and leg. My suspicion for stroke is very low in him. I strongly suspect conversion disorder in his case.  Impression: Left sided weakness Evaluate for stroke Evaluate for conversion disorder  Recommendations: I recommend an MRI of the brain. If it is negative for stroke, I do not recommend pursuing any further stroke workup. I will follow the MRI and update by recommendations as an addendum to the note upon completion of the MRI.  -- Milon Dikes, MD [facility] [phone]  If 7pm to 7am, please call on call as listed on AMION.   Addendum 0436 hrs MRI of the brain and MRA of the head and neck completed. No evidence of stroke. No further neurological workup is indicated. Most likely conversion disorder.

## 2016-11-27 NOTE — ED Provider Notes (Signed)
[facility]-EMERGENCY DEPT Provider Note   CSN: [id number] Arrival date & time: 11/27/16  1245     History   Chief Complaint Chief Complaint  Patient presents with  . Chest Pain    HPI Garrett Dudley is a 41 y.o. male.  Patient with stroke history and mild right-sided deficit presents with chest pain and left arm weakness. Patient woke up this morning at 10:00 and had chest pressure mild radiation to left shoulder blade and then realized he had weakness in the left arm and mild in the left leg.Patient has never had this before. No heart attack history. Pain is severe. No headache or other neurologic signs or symptoms.      Past Medical History:  Diagnosis Date  . Heart murmur   . Stroke [facility])    TIA 2012    Patient Active Problem List   Diagnosis Date Noted  . 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 Other malaise and fatigue 10/08/2013    Past Surgical History:  Procedure Laterality Date  . EYE SURGERY         Home Medications    Prior to Admission medications   Not on File    Family History History reviewed. No pertinent family history.  Social History Social History  Substance Use Topics  . Smoking status: Former Games developer  . Smokeless tobacco: Never Used  . Alcohol use No     Allergies   Patient has no known allergies.   Review of Systems Review of Systems  Constitutional: Negative for chills and fever.  HENT: Negative for congestion.   Eyes: Negative for visual disturbance.  Respiratory: Negative for shortness of breath.   Cardiovascular: Positive for chest pain.  Gastrointestinal: Negative for abdominal pain and vomiting.  Genitourinary: Negative for dysuria and flank pain.  Musculoskeletal: Negative for back pain, neck pain and neck stiffness.  Skin: Negative for rash.  Neurological: Positive for weakness and numbness. Negative for light-headedness and headaches.     Physical Exam Updated Vital Signs BP 96/66   Pulse 64   Temp 98.7 F  (37.1 C) (Oral)   Resp 16   Ht 5' 11.5" (1.816 m)   Wt 99.8 kg (220 lb)   SpO2 100%   BMI 30.26 kg/m   Physical Exam  Constitutional: He is oriented to person, place, and time. He appears well-developed and well-nourished. He appears distressed.  HENT:  Head: Normocephalic and atraumatic.  Eyes: Conjunctivae are normal. Right eye exhibits no discharge. Left eye exhibits no discharge.  Neck: Normal range of motion. Neck supple. No tracheal deviation present.  Cardiovascular: Normal rate and regular rhythm.   Pulmonary/Chest: Effort normal and breath sounds normal.  Abdominal: Soft. He exhibits no distension. There is no tenderness. There is no guarding.  Musculoskeletal: He exhibits tenderness. He exhibits no edema.  Neurological: He is alert and oriented to person, place, and time.  Patient has weakness in the left arm with left arm drift mild difficulty with exam due to pain component however cannot hold left arm against gravity. Patient has mild left leg weakness however can't hold leg against gravity but weaker than right. Cranial nerves intact. Sensation decreased left versus right arm.  Skin: Skin is warm. No rash noted.  Psychiatric: He has a normal mood and affect.  Nursing note and vitals reviewed.    ED Treatments / Results  Labs (all labs ordered are listed, but only abnormal results are displayed) Labs Reviewed  COMPREHENSIVE METABOLIC PANEL - Abnormal; Notable for the  following:       Result Value   Glucose, Bld 100 (*)    Calcium 8.7 (*)    Total Protein 6.1 (*)    All other components within normal limits  CBC  TROPONIN I    EKG  EKG Interpretation  Date/Time:  Saturday November 27 2016 13:00:28 EDT Ventricular Rate:  70 PR Interval:    QRS Duration: 80 QT Interval:  377 QTC Calculation: 407 R Axis:   43 Text Interpretation:  Sinus rhythm Confirmed by Blane OharaZavitz, Autumn Gunn [phone]([id number]) on 11/27/2016 1:11:31 PM       Radiology Ct Head Wo Contrast  Result Date:  11/27/2016 CLINICAL DATA:  Chest pain upon awakening this morning, LEFT arm and back pain, moderate LEFT leg weakness, decreased sensation LEFT arm, subacute neurological deficits, former smoker, history stroke EXAM: CT HEAD WITHOUT CONTRAST TECHNIQUE: Contiguous axial images were obtained from the base of the skull through the vertex without intravenous contrast. Sagittal and coronal MPR images reconstructed from axial data set. COMPARISON:  10/03/2013 FINDINGS: Brain: Normal ventricular morphology. No midline shift or mass effect. Normal appearance of brain parenchyma. No intracranial hemorrhage, mass lesion or evidence of acute infarction. No extra-axial fluid collections. Vascular: Normal appearance Skull: Normal appearance Sinuses/Orbits: Clear Other: N/A IMPRESSION: Normal exam. Electronically Signed   By: Ulyses SouthwardMark  Boles M.D.   On: 11/27/2016 14:33   Ct Angio Chest/abd/pel For Dissection W And/or Wo Contrast  Result Date: 11/27/2016 CLINICAL DATA:  Chest pain EXAM: CT ANGIOGRAPHY CHEST, ABDOMEN AND PELVIS TECHNIQUE: Multidetector CT imaging through the chest, abdomen and pelvis was performed using the standard protocol during bolus administration of intravenous contrast. Multiplanar reconstructed images and MIPs were obtained and reviewed to evaluate the vascular anatomy. CONTRAST:  100 mL Isovue 370. COMPARISON:  None. FINDINGS: CTA CHEST FINDINGS Cardiovascular: Thoracic aorta shows a normal branching pattern. No aneurysmal dilatation or dissection is noted. Pulmonary artery is well visualize with a normal branching pattern. No evidence of pulmonary embolism is seen. No cardiac enlargement is noted. No coronary calcifications are seen. Mediastinum/Nodes: No enlarged mediastinal, hilar, or axillary lymph nodes. Thyroid gland, trachea, and esophagus demonstrate no significant findings. Lungs/Pleura: Mild bibasilar atelectatic changes are noted. No focal confluent infiltrate is seen. No sizable effusion is  noted. No pulmonary nodules are seen. Musculoskeletal: No chest wall abnormality. No acute or significant osseous findings. Review of the MIP images confirms the above findings. CTA ABDOMEN AND PELVIS FINDINGS VASCULAR Aorta: Mild atherosclerotic changes are noted without aneurysmal dilatation. Celiac: Patent without evidence of aneurysm, dissection, vasculitis or significant stenosis. SMA: Patent without evidence of aneurysm, dissection, vasculitis or significant stenosis. Renals: Dual renal arteries are noted on the left. Single renal artery on the right. They are widely patent. IMA: Patent without evidence of aneurysm, dissection, vasculitis or significant stenosis. Iliacs: Widely patent Veins: Within normal limits. Review of the MIP images confirms the above findings. NON-VASCULAR Hepatobiliary: No focal liver abnormality is seen. No gallstones, gallbladder wall thickening, or biliary dilatation. Pancreas: Unremarkable. No pancreatic ductal dilatation or surrounding inflammatory changes. Spleen: Normal in size without focal abnormality. Adrenals/Urinary Tract: Adrenal glands are unremarkable. Kidneys are normal, without renal calculi, focal lesion, or hydronephrosis. Bladder is unremarkable. Stomach/Bowel: Stomach is within normal limits. Appendix appears normal. No evidence of bowel wall thickening, distention, or inflammatory changes. Lymphatic: No significant adenopathy is noted. Reproductive: Prostate is unremarkable. Other: No abdominal wall hernia or abnormality. No abdominopelvic ascites. Musculoskeletal: No acute or significant osseous findings. Review of the MIP images  confirms the above findings. IMPRESSION: No evidence of aortic dissection or pulmonary emboli. Mild bibasilar atelectatic changes. No other focal abnormality is seen. Electronically Signed   By: Alcide Clever M.D.   On: 11/27/2016 14:43    Procedures Procedures (including critical care time)  Medications Ordered in ED Medications    fentaNYL (SUBLIMAZE) injection 50 mcg (50 mcg Intravenous Given 11/27/16 1327)  aspirin chewable tablet 324 mg (not administered)  iopamidol (ISOVUE-370) 76 % injection 100 mL (100 mLs Intravenous Contrast Given 11/27/16 1357)     Initial Impression / Assessment and Plan / ED Course  I have reviewed the triage vital signs and the nursing notes.  Pertinent labs & imaging results that were available during my care of the patient were reviewed by me and considered in my medical decision making (see chart for details).    Patient presents with significant chest pain and left arm weakness. Discussed with radiology for CT dissection study. Patient will also receive CT head without contrast blood work cardiac screen with chest pain. Left arm may be palsy from sleeping however would not explain left leg weakness.  On reassessment patient having persistent left arm weakness. CT scans no acute finding. Aspirin ordered and discussed with hospitalist for admission for further workup and MRI.  The patients results and plan were reviewed and discussed.   Any x-rays performed were independently reviewed by myself.   Differential diagnosis were considered with the presenting HPI.  Medications  fentaNYL (SUBLIMAZE) injection 50 mcg (50 mcg Intravenous Given 11/27/16 1327)  aspirin chewable tablet 324 mg (not administered)  iopamidol (ISOVUE-370) 76 % injection 100 mL (100 mLs Intravenous Contrast Given 11/27/16 1357)    Vitals:   11/27/16 1300 11/27/16 1301 11/27/16 1302 11/27/16 1501  BP: (!) 142/85 (!) 142/85  96/66  Pulse: 63 62  64  Resp: 17 18  16   Temp:  98.7 F (37.1 C)    TempSrc:  Oral    SpO2: 99% 98%  100%  Weight:   99.8 kg (220 lb)   Height:   5' 11.5" (1.816 m)     Final diagnoses:  Left arm weakness  Acute chest pain    Admission/ observation were discussed with the admitting physician, patient and/or family and they are comfortable with the plan.       Final Clinical  Impressions(s) / ED Diagnoses   Final diagnoses:  Left arm weakness  Acute chest pain    New Prescriptions New Prescriptions   No medications on file     Blane Ohara, MD 11/27/16 1521

## 2016-11-27 NOTE — Plan of Care (Signed)
Problem: Education: Goal: Knowledge of disease or condition will improve Patient educated on what a stroke is and the most common causes of strokes. We discussed what about his health condition could have caused him to have a stroke.

## 2016-11-27 NOTE — ED Notes (Signed)
[facility].

## 2016-11-27 NOTE — Progress Notes (Signed)
Arrived from [facility]. Alert and oriented. Left side body pain 7/10. Minimally lift up left arm and left leg. Call light within reach.

## 2016-11-27 NOTE — H&P (Signed)
History and Physical    Garrett Dudley ZOX:[id number] DOB: [birth year] DOA: 11/27/2016  Referring MD/NP/PA: Jodi Dudley PCP: Patient, No Pcp Per  Outpatient Specialists: none  Patient coming from: Home   Chief Complaint: CP, L arm weakness  HPI: Garrett Dudley is a 41 y.o. male with medical history significant of TIA presenting w/ CP and L sided weakness. Sxs started around 10am per pt. Moderate to severe CP w/ radiation to L arm as well as radiation to back. No nausea, diaphoresis. No abd pain. No prior hx/o CAD. No dyspnea. Denies sxs waking him up from sleep. CP worse w/ movement and deep breathing. No reported illicit drug use. Denies any similar episodes in the past. Remote hx/o ? TIA in the past, though pt is not specifying.     ED Course: Presented to ER afebrile, hemodynamically stable. CT Angi chest negative for PE. Trop neg x1. EKG WNL. Given full dose ASA s/p normal CT imaging.   Review of Systems: As per HPI otherwise 10 point review of systems negative.    Past Medical History:  Diagnosis Date  . Heart murmur   . Stroke [facility])    TIA 2012    Past Surgical History:  Procedure Laterality Date  . EYE SURGERY       reports that he has quit smoking. He has never used smokeless tobacco. He reports that he does not drink alcohol or use drugs.  No Known Allergies  History reviewed. No pertinent family history.   Prior to Admission medications   Not on File    Physical Exam: Vitals:   11/27/16 1300 11/27/16 1301 11/27/16 1302 11/27/16 1501  BP: (!) 142/85 (!) 142/85  96/66  Pulse: 63 62  64  Resp: 17 18  16   Temp:  98.7 F (37.1 C)    TempSrc:  Oral    SpO2: 99% 98%  100%  Weight:   99.8 kg (220 lb)   Height:   5' 11.5" (1.816 m)       Constitutional: NAD, calm, comfortable Vitals:   11/27/16 1300 11/27/16 1301 11/27/16 1302 11/27/16 1501  BP: (!) 142/85 (!) 142/85  96/66  Pulse: 63 62  64  Resp: 17 18  16   Temp:  98.7 F (37.1 C)    TempSrc:  Oral      SpO2: 99% 98%  100%  Weight:   99.8 kg (220 lb)   Height:   5' 11.5" (1.816 m)    Eyes: PERRL, lids and conjunctivae normal ENMT: Mucous membranes are moist. Posterior pharynx clear of any exudate or lesions.Normal dentition.  Neck: normal, supple, no masses, no thyromegaly Respiratory: clear to auscultation bilaterally, no wheezing, no crackles. Normal respiratory effort. No accessory muscle use.  Cardiovascular: Regular rate and rhythm, no murmurs / rubs / gallops. No extremity edema. 2+ pedal pulses. No carotid bruits. + anterior chest wall TTP.  Abdomen: no tenderness, no masses palpated. No hepatosplenomegaly. Bowel sounds positive.  Musculoskeletal: no clubbing / cyanosis. No joint deformity upper and lower extremities. Good ROM, no contractures. Normal muscle tone.  Skin: no rashes, lesions, ulcers. No induration Neurologic: CN 2-12 grossly intact. Sensation intact, + Weakness L upper and L lower extremity- more predominant in LUE  Psychiatric: Normal judgment and insight. Alert and oriented x 3. Normal mood.    Labs on Admission: I have personally reviewed following labs and imaging studies  CBC:  Recent Labs Lab 11/27/16 1320  WBC 6.5  HGB 14.1  HCT 42.3  MCV  93.6  PLT 227   Basic Metabolic Panel:  Recent Labs Lab 11/27/16 1320  NA 138  K 4.4  CL 107  CO2 25  GLUCOSE 100*  BUN 13  CREATININE 1.12  CALCIUM 8.7*   GFR: Estimated Creatinine Clearance: 103.2 mL/min (by C-G formula based on SCr of 1.12 mg/dL). Liver Function Tests:  Recent Labs Lab 11/27/16 1320  AST 26  ALT 28  ALKPHOS 40  BILITOT 0.5  PROT 6.1*  ALBUMIN 3.6   No results for input(s): LIPASE, AMYLASE in the last 168 hours. No results for input(s): AMMONIA in the last 168 hours. Coagulation Profile: No results for input(s): INR, PROTIME in the last 168 hours. Cardiac Enzymes:  Recent Labs Lab 11/27/16 1320  TROPONINI <0.03   BNP (last 3 results) No results for input(s):  PROBNP in the last 8760 hours. HbA1C: No results for input(s): HGBA1C in the last 72 hours. CBG: No results for input(s): GLUCAP in the last 168 hours. Lipid Profile: No results for input(s): CHOL, HDL, LDLCALC, TRIG, CHOLHDL, LDLDIRECT in the last 72 hours. Thyroid Function Tests: No results for input(s): TSH, T4TOTAL, FREET4, T3FREE, THYROIDAB in the last 72 hours. Anemia Panel: No results for input(s): VITAMINB12, FOLATE, FERRITIN, TIBC, IRON, RETICCTPCT in the last 72 hours. Urine analysis: No results found for: COLORURINE, APPEARANCEUR, LABSPEC, PHURINE, GLUCOSEU, HGBUR, BILIRUBINUR, KETONESUR, PROTEINUR, UROBILINOGEN, NITRITE, LEUKOCYTESUR Sepsis Labs: @LABRCNTIP (procalcitonin:4,lacticidven:4) )No results found for this or any previous visit (from the past 240 hour(s)).   Radiological Exams on Admission: Ct Head Wo Contrast  Result Date: 11/27/2016 CLINICAL DATA:  Chest pain upon awakening this morning, LEFT arm and back pain, moderate LEFT leg weakness, decreased sensation LEFT arm, subacute neurological deficits, former smoker, history stroke EXAM: CT HEAD WITHOUT CONTRAST TECHNIQUE: Contiguous axial images were obtained from the base of the skull through the vertex without intravenous contrast. Sagittal and coronal MPR images reconstructed from axial data set. COMPARISON:  10/03/2013 FINDINGS: Brain: Normal ventricular morphology. No midline shift or mass effect. Normal appearance of brain parenchyma. No intracranial hemorrhage, mass lesion or evidence of acute infarction. No extra-axial fluid collections. Vascular: Normal appearance Skull: Normal appearance Sinuses/Orbits: Clear Other: N/A IMPRESSION: Normal exam. Electronically Signed   By: Ulyses SouthwardMark  Boles M.D.   On: 11/27/2016 14:33   Ct Angio Chest/abd/pel For Dissection W And/or Wo Contrast  Result Date: 11/27/2016 CLINICAL DATA:  Chest pain EXAM: CT ANGIOGRAPHY CHEST, ABDOMEN AND PELVIS TECHNIQUE: Multidetector CT imaging through  the chest, abdomen and pelvis was performed using the standard protocol during bolus administration of intravenous contrast. Multiplanar reconstructed images and MIPs were obtained and reviewed to evaluate the vascular anatomy. CONTRAST:  100 mL Isovue 370. COMPARISON:  None. FINDINGS: CTA CHEST FINDINGS Cardiovascular: Thoracic aorta shows a normal branching pattern. No aneurysmal dilatation or dissection is noted. Pulmonary artery is well visualize with a normal branching pattern. No evidence of pulmonary embolism is seen. No cardiac enlargement is noted. No coronary calcifications are seen. Mediastinum/Nodes: No enlarged mediastinal, hilar, or axillary lymph nodes. Thyroid gland, trachea, and esophagus demonstrate no significant findings. Lungs/Pleura: Mild bibasilar atelectatic changes are noted. No focal confluent infiltrate is seen. No sizable effusion is noted. No pulmonary nodules are seen. Musculoskeletal: No chest wall abnormality. No acute or significant osseous findings. Review of the MIP images confirms the above findings. CTA ABDOMEN AND PELVIS FINDINGS VASCULAR Aorta: Mild atherosclerotic changes are noted without aneurysmal dilatation. Celiac: Patent without evidence of aneurysm, dissection, vasculitis or significant stenosis. SMA: Patent without  evidence of aneurysm, dissection, vasculitis or significant stenosis. Renals: Dual renal arteries are noted on the left. Single renal artery on the right. They are widely patent. IMA: Patent without evidence of aneurysm, dissection, vasculitis or significant stenosis. Iliacs: Widely patent Veins: Within normal limits. Review of the MIP images confirms the above findings. NON-VASCULAR Hepatobiliary: No focal liver abnormality is seen. No gallstones, gallbladder wall thickening, or biliary dilatation. Pancreas: Unremarkable. No pancreatic ductal dilatation or surrounding inflammatory changes. Spleen: Normal in size without focal abnormality. Adrenals/Urinary  Tract: Adrenal glands are unremarkable. Kidneys are normal, without renal calculi, focal lesion, or hydronephrosis. Bladder is unremarkable. Stomach/Bowel: Stomach is within normal limits. Appendix appears normal. No evidence of bowel wall thickening, distention, or inflammatory changes. Lymphatic: No significant adenopathy is noted. Reproductive: Prostate is unremarkable. Other: No abdominal wall hernia or abnormality. No abdominopelvic ascites. Musculoskeletal: No acute or significant osseous findings. Review of the MIP images confirms the above findings. IMPRESSION: No evidence of aortic dissection or pulmonary emboli. Mild bibasilar atelectatic changes. No other focal abnormality is seen. Electronically Signed   By: Alcide Clever M.D.   On: 11/27/2016 14:43    EKG: Independently reviewed. NSR  Assessment/Plan Active Problems:   Left arm weakness   Chest pain     1-L arm weakness -clinical exam indicative of L arm and mild LLE weakness -given hx/o ? TIA vs CVA in the past, will need formal CVA w/u -outside window for tPa given time of presentation  -head CT WNL  -MRI, MRA, 2D ECHO, carotid dopplers -risk stratification labs  -start on full dose ASA   2-Chest Pain  -Atypical  -CT Angio negative for dissection  -trop WNL x 1 -cycle CEs -risk stratification labs  -tele monitoring      DVT prophylaxis: lovenox   Code Status: Full Code   Family Communication: Spouse and Daughter at bedside   Disposition Plan: Pending further evaluation  Consults called: pending TeleNeuro formal consult per Dr. Carola Rhine  Admission status: Inpt     Floydene Flock MD [facility] Pager [phone]  If 7PM-7AM, please contact night-coverage [email or web address] Password [facility]  11/27/2016, 3:44 PM

## 2016-11-27 NOTE — Progress Notes (Signed)
Report given to 62M RN. Pt left facility via stretcher with [facility]. No s/s of any distress.

## 2016-11-28 ENCOUNTER — Inpatient Hospital Stay (HOSPITAL_COMMUNITY): Payer: Self-pay

## 2016-11-28 DIAGNOSIS — R29898 Other symptoms and signs involving the musculoskeletal system: Secondary | ICD-10-CM

## 2016-11-28 DIAGNOSIS — R0789 Other chest pain: Secondary | ICD-10-CM

## 2016-11-28 LAB — ECHOCARDIOGRAM COMPLETE
Height: 72 in
Weight: 4021.19 oz

## 2016-11-28 LAB — HIV ANTIBODY (ROUTINE TESTING W REFLEX): HIV Screen 4th Generation wRfx: NONREACTIVE

## 2016-11-28 MED ORDER — CYCLOBENZAPRINE HCL 5 MG PO TABS: 5.0000 mg | ORAL_TABLET | Freq: Three times a day (TID) | ORAL | 0 refills | Status: DC | PRN

## 2016-11-28 NOTE — Progress Notes (Signed)
D/c reviewed with PT and girlfriend. He is encouraged to follow up with PCP

## 2016-11-28 NOTE — Progress Notes (Signed)
Occupational Therapy Evaluation Patient Details Name: Garrett Dudley MRN: [id number] DOB: Apr 23, 1975 Today's Date: 11/28/2016    History of Present Illness 41 y.o. male with medical history significant of TIA presenting w/ CP and L sided weakness. MRI - for CVA. Per pt, history of R hand injury.   Clinical Impression   PTA, pt lived at home and was independent with ADL and mobility. Pt with inconsistent performance on MMT, therapeutic activities, balance assessment and mobility throughout assessment. Pt educated on scapular/shoulder stretches. Pt safe to DC home when medically stable. OT signing off.     Follow Up Recommendations  No OT follow up;Supervision - Intermittent    Equipment Recommendations  None recommended by OT    Recommendations for Other Services       Precautions / Restrictions Precautions Precautions: Fall      Mobility Bed Mobility Overal bed mobility: Independent             General bed mobility comments: Impulsive  Transfers Overall transfer level: Needs assistance Equipment used: None Transfers: Sit to/from Stand Sit to Stand: Min guard         General transfer comment: Pt requested assistance to stand. initially unsteady with standing. pushes up to stand primarily with RLE    Balance Overall balance assessment: Needs assistance Sitting-balance support: Feet supported Sitting balance-Leahy Scale: Good Sitting balance - Comments: Sat at EOB with no loss of balance   Standing balance support: No upper extremity supported Standing balance-Leahy Scale: Fair                             ADL either performed or assessed with clinical judgement   ADL Overall ADL's : At baseline                                             Vision Baseline Vision/History: No visual deficits Vision Assessment?: No apparent visual deficits     Perception Perception Comments: Appears intact   Praxis Praxis Praxis tested?:  Within functional limits    Pertinent Vitals/Pain Pain Assessment: Faces Faces Pain Scale: Hurts even more Pain Location: L scapular are Pain Descriptors / Indicators: Nagging;Discomfort;Grimacing;Guarding Pain Intervention(s): Limited activity within patient's tolerance     Hand Dominance Right   Extremity/Trunk Assessment Upper Extremity Assessment Upper Extremity Assessment: LUE deficits/detail LUE Deficits / Details: Pt comlaining of LUE weakness/pain. Pt is only able to complete @ 45 degrees abduction/FF, however, when phone rang, pt able to lift LUE and reach for phone without difficulty or complaints of pain. LUE overall weaker than RUE. Pt using RUE tolift LUE. grip strength @ 3+/5. elbow flex/ext @ 4/5. Marland Kitchen. inconsistent with sensation. States "you're touching me too high there" LUE Sensation: decreased light touch (Reports decreased sensation - inconsistent)   Lower Extremity Assessment Lower Extremity Assessment: Defer to PT evaluation   Cervical / Trunk Assessment Cervical / Trunk Assessment: Normal   Communication Communication Communication: No difficulties   Cognition Arousal/Alertness: Awake/alert Behavior During Therapy: WFL for tasks assessed/performed Overall Cognitive Status: Within Functional Limits for tasks assessed                                     General Comments  Inconsistent responses during balance assessment. Pt unsteady  with head turns when following command, however, when spontaneously turning head, no LOB/unsteadiness    Exercises Exercises: Other exercises Other Exercises Other Exercises: Pt educated on general scapular stretching, especially in protraction, cervical rotation and lateral flexion away from painful shoulder   Shoulder Instructions      Home Living Family/patient expects to be discharged to:: Private residence Living Arrangements: Spouse/significant other Available Help at Discharge: Family Type of Home:  House Home Access: Stairs to enter Secretary/administrator of Steps: 3 Entrance Stairs-Rails: None Home Layout: One level     Bathroom Shower/Tub: Tub only   Firefighter: Standard Bathroom Accessibility: Yes How Accessible: Accessible via wheelchair;Accessible via walker Home Equipment: None          Prior Functioning/Environment Level of Independence: Independent                 OT Problem List: Decreased strength;Decreased range of motion;Impaired balance (sitting and/or standing);Obesity;Impaired sensation;Impaired UE functional use;Pain      OT Treatment/Interventions:      OT Goals(Current goals can be found in the care plan section) Acute Rehab OT Goals Patient Stated Goal: drive to [place] today OT Goal Formulation: All assessment and education complete, DC therapy  OT Frequency:     Barriers to D/C:            Co-evaluation PT/OT/SLP Co-Evaluation/Treatment: Yes Reason for Co-Treatment: For patient/therapist safety          AM-PAC PT "6 Clicks" Daily Activity     Outcome Measure Help from another person eating meals?: None Help from another person taking care of personal grooming?: None Help from another person toileting, which includes using toliet, bedpan, or urinal?: None Help from another person bathing (including washing, rinsing, drying)?: None Help from another person to put on and taking off regular upper body clothing?: None Help from another person to put on and taking off regular lower body clothing?: None 6 Click Score: 24   End of Session Equipment Utilized During Treatment: Gait belt Nurse Communication: Mobility status  Activity Tolerance: Patient tolerated treatment well Patient left: in chair;with call bell/phone within reach;with family/visitor present  OT Visit Diagnosis: Unsteadiness on feet (R26.81);Pain Pain - Right/Left: Left Pain - part of body: Shoulder                Time: 1610-9604 OT Time Calculation (min):  33 min Charges:  OT General Charges $OT Visit: 1 Procedure OT Evaluation $OT Eval Low Complexity: 1 Procedure G-Codes:     Raoul Ciano, OT/L  [phone] 11/28/2016  Christoph Copelan,HILLARY 11/28/2016, 9:46 AM

## 2016-11-28 NOTE — Evaluation (Signed)
Physical Therapy Evaluation Patient Details Name: Garrett Dudley MRN: [id number] DOB: 1976-04-04 Today's Date: 11/28/2016   History of Present Illness  41 y.o. male with medical history significant of TIA presenting w/ CP and L sided weakness. MRI - for CVA. Per pt, history of R hand injury.  Clinical Impression  PT eval complete. Min guard assist provided for safety with transfers and gait. No physical assist needed. Pt inconsistent with MMT and balance testing.  With distraction, his deficits improved and he presented with a gait pattern WNL. No further PT services indicated. PT signing off.    Follow Up Recommendations No PT follow up;Supervision - Intermittent    Equipment Recommendations  None recommended by PT    Recommendations for Other Services       Precautions / Restrictions Precautions Precautions: Fall      Mobility  Bed Mobility Overal bed mobility: Independent             General bed mobility comments: Impulsive  Transfers Overall transfer level: Needs assistance Equipment used: None Transfers: Sit to/from Stand Sit to Stand: Min guard         General transfer comment: min guard for safety. No physical assist needed. Increased time to stabilize initial standing balance.  Ambulation/Gait Ambulation/Gait assistance: Min guard Ambulation Distance (Feet): 225 Feet Assistive device: None Gait Pattern/deviations: Decreased stride length;Step-through pattern;Antalgic Gait velocity: decreased Gait velocity interpretation: Below normal speed for age/gender General Gait Details: Pt ambulating with a 'limp' on the left. When distracted, his gait pattern normalizes.  Stairs Stairs: Yes Stairs assistance: Min guard Stair Management: One rail Right;Forwards;Alternating pattern Number of Stairs: 5    Wheelchair Mobility    Modified Rankin (Stroke Patients Only) Modified Rankin (Stroke Patients Only) Pre-Morbid Rankin Score: No symptoms Modified  Rankin: No significant disability     Balance Overall balance assessment: Needs assistance Sitting-balance support: Feet supported;No upper extremity supported Sitting balance-Leahy Scale: Good Sitting balance - Comments: Sat at EOB with no loss of balance   Standing balance support: No upper extremity supported;During functional activity Standing balance-Leahy Scale: Good                       Dynamic Gait Index Level Surface: Normal Change in Gait Speed: Normal Gait with Horizontal Head Turns: Normal Gait with Vertical Head Turns: Normal Gait and Pivot Turn: Normal Step Over Obstacle: Mild Impairment Step Around Obstacles: Mild Impairment Steps: Normal Total Score: 22       Pertinent Vitals/Pain Pain Assessment: Faces Faces Pain Scale: Hurts even more Pain Location: L scapular area Pain Descriptors / Indicators: Nagging;Discomfort;Grimacing;Guarding Pain Intervention(s): Monitored during session;Repositioned    Home Living Family/patient expects to be discharged to:: Private residence Living Arrangements: Spouse/significant other Available Help at Discharge: Family Type of Home: House Home Access: Stairs to enter Entrance Stairs-Rails: None Secretary/administrator of Steps: 3 Home Layout: One level Home Equipment: None      Prior Function Level of Independence: Independent               Hand Dominance   Dominant Hand: Right    Extremity/Trunk Assessment   Upper Extremity Assessment Upper Extremity Assessment: Defer to OT evaluation LUE Deficits / Details: Pt comlaining of LUE weakness/pain. Pt is only able to complete @ 45 degrees abduction/FF, however, when phone rang, pt able to lift LUE and reach for phone without difficulty or complaints of pain. LUE overall weaker than RUE. Pt using RUE tolift LUE. grip strength @  3+/5. elbow flex/ext @ 4/5. Marland Kitchen. inconsistent with sensation. States "you're touching me too high there" LUE Sensation: decreased  light touch (Reports decreased sensation - inconsistent)    Lower Extremity Assessment Lower Extremity Assessment: LLE deficits/detail LLE Deficits / Details: Per MMT, pt with < 3/5 strength due to inability to lift against gravity. He was, however, able to ambulate without AD and manage stairs with reciprocal pattern.    Cervical / Trunk Assessment Cervical / Trunk Assessment: Normal  Communication   Communication: No difficulties  Cognition Arousal/Alertness: Awake/alert Behavior During Therapy: WFL for tasks assessed/performed Overall Cognitive Status: Within Functional Limits for tasks assessed                                        General Comments General comments (skin integrity, edema, etc.): Inconsistent responses during balance assessment. Pt unsteady with head turns when following commands. However, no unsteadiness noted with spontaneous head turns.    Exercises Other Exercises Other Exercises: Pt educated on general scapular stretching, especially in protraction, cervical rotation and lateral flexion away from painful shoulder   Assessment/Plan    PT Assessment Patent does not need any further PT services  PT Problem List         PT Treatment Interventions      PT Goals (Current goals can be found in the Care Plan section)  Acute Rehab PT Goals Patient Stated Goal: drive to [place] today PT Goal Formulation: All assessment and education complete, DC therapy    Frequency     Barriers to discharge        Co-evaluation PT/OT/SLP Co-Evaluation/Treatment: Yes Reason for Co-Treatment: For patient/therapist safety PT goals addressed during session: Mobility/safety with mobility;Balance         AM-PAC PT "6 Clicks" Daily Activity  Outcome Measure Difficulty turning over in bed (including adjusting bedclothes, sheets and blankets)?: None Difficulty moving from lying on back to sitting on the side of the bed? : None Difficulty sitting down on  and standing up from a chair with arms (e.g., wheelchair, bedside commode, etc,.)?: None Help needed moving to and from a bed to chair (including a wheelchair)?: None Help needed walking in hospital room?: None Help needed climbing 3-5 steps with a railing? : A Little 6 Click Score: 23    End of Session Equipment Utilized During Treatment: Gait belt Activity Tolerance: Patient tolerated treatment well Patient left: in chair;with call bell/phone within reach;with family/visitor present Nurse Communication: Mobility status PT Visit Diagnosis: Unsteadiness on feet (R26.81)    Time: 1610-9604: 0852-0922 PT Time Calculation (min) (ACUTE ONLY): 30 min   Charges:   PT Evaluation $PT Eval Moderate Complexity: 1 Mod     PT G Codes:        Aida RaiderWendy Andros Channing, PT  Office # [phone] Pager [phone]   Ilda FoilGarrow, Sherlynn Tourville Rene 11/28/2016, 10:16 AM

## 2016-11-28 NOTE — Progress Notes (Signed)
  Echocardiogram 2D Echocardiogram has been performed.  Garrett Dudley 11/28/2016, 11:53 AM

## 2016-11-28 NOTE — Progress Notes (Signed)
CM spoke with pt and gave pt [facility] NUMBER [id number], to secure a PCP (also placed on AVS). No other CM needs were communicated.

## 2016-11-28 NOTE — Discharge Summary (Signed)
Physician Discharge Summary  Garrett Dudley RUE:[id number] DOB: Oct 06, 1975 DOA: 11/27/2016  PCP: Patient, No Pcp Per  Admit date: 11/27/2016 Discharge date: 11/28/2016  Admitted From: Home Disposition: Home  Recommendations for Outpatient Follow-up:  1. Follow up with PCP in 1 week (Care management consulted for PCP needs) 2. Pending studies: Echocardiogram  Home Health: None Equipment/Devices: None  Discharge Condition: Stable CODE STATUS: Full code Diet recommendation: Regular diet   Brief/Interim Summary:  Admission HPI written by Floydene Flock, MD   Chief Complaint: CP, L arm weakness  HPI: Garrett Dudley is a 41 y.o. male with medical history significant of TIA presenting w/ CP and L sided weakness. Sxs started around 10am per pt. Moderate to severe CP w/ radiation to L arm as well as radiation to back. No nausea, diaphoresis. No abd pain. No prior hx/o CAD. No dyspnea. Denies sxs waking him up from sleep. CP worse w/ movement and deep breathing. No reported illicit drug use. Denies any similar episodes in the past. Remote hx/o ? TIA in the past, though pt is not specifying.     ED Course: Presented to ER afebrile, hemodynamically stable. CT Angi chest negative for PE. Trop neg x1. EKG WNL. Given full dose ASA s/p normal CT imaging.    Hospital course:  Left arm weakness Stroke ruled out. Neurology consulted and feel like patient likely has conversion disorder. My evaluation, I agree versus possible malingering as well. Patient had very inconsistent physical exam results with weakness on formal exam and no apparent weakness when discussing other topics. Possible musculoskeletal component for his left-sided back pain and will prescribe Flexeril for this problem.  Atypical chest pain Resolved. Troponin negative. Low risk for ACS. Recommend outpatient follow-up.  Discharge Diagnoses:  Active Problems:   Left arm weakness   Chest pain    Discharge  Instructions   Allergies as of 11/28/2016   No Known Allergies     Medication List    TAKE these medications   cyclobenzaprine 5 MG tablet Commonly known as:  FLEXERIL Take 1 tablet (5 mg total) by mouth 3 (three) times daily as needed for muscle spasms.       No Known Allergies  Consultations:  Neurology   Procedures/Studies: Ct Head Wo Contrast  Result Date: 11/27/2016 CLINICAL DATA:  Chest pain upon awakening this morning, LEFT arm and back pain, moderate LEFT leg weakness, decreased sensation LEFT arm, subacute neurological deficits, former smoker, history stroke EXAM: CT HEAD WITHOUT CONTRAST TECHNIQUE: Contiguous axial images were obtained from the base of the skull through the vertex without intravenous contrast. Sagittal and coronal MPR images reconstructed from axial data set. COMPARISON:  10/03/2013 FINDINGS: Brain: Normal ventricular morphology. No midline shift or mass effect. Normal appearance of brain parenchyma. No intracranial hemorrhage, mass lesion or evidence of acute infarction. No extra-axial fluid collections. Vascular: Normal appearance Skull: Normal appearance Sinuses/Orbits: Clear Other: N/A IMPRESSION: Normal exam. Electronically Signed   By: Ulyses Southward M.D.   On: 11/27/2016 14:33   Mr Brain Wo Contrast  Result Date: 11/28/2016 CLINICAL DATA:  41 y/o M; history of stroke and mild right-sided deficits presenting with chest pain and left arm weakness. EXAM: MRI HEAD WITHOUT CONTRAST MRA HEAD WITHOUT CONTRAST TECHNIQUE: Axial DWI and axial T2 weighted sequences were acquired. Angiographic images of the head were obtained using MRA technique without contrast. COMPARISON:  11/27/2016 CT head FINDINGS: MRI HEAD FINDINGS Brain: No reduced diffusion to suggest acute or early subacute infarct. No focal mass  effect or hydrocephalus. Vascular: As below. Skull and upper cervical spine: Normal marrow signal. Sinuses/Orbits: Mild diffuse paranasal sinus mucosal thickening.  No abnormal signal of mastoid air cells. Orbits are unremarkable. Other: None. MRA HEAD FINDINGS Motion degradation, suboptimal assessment for stenosis or aneurysm. Anterior circulation:  No large vessel occlusion. Posterior circulation: No large vessel occlusion. Anatomic variant: Patent anterior and bilateral posterior communicating arteries. IMPRESSION: 1. No acute intracranial abnormality identified. 2. Motion degraded MRA.  No large vessel occlusion. Electronically Signed   By: Mitzi Hansen M.D.   On: 11/28/2016 03:39   Mr Maxine Glenn Head/brain ZO Cm  Result Date: 11/28/2016 CLINICAL DATA:  41 y/o M; history of stroke and mild right-sided deficits presenting with chest pain and left arm weakness. EXAM: MRI HEAD WITHOUT CONTRAST MRA HEAD WITHOUT CONTRAST TECHNIQUE: Axial DWI and axial T2 weighted sequences were acquired. Angiographic images of the head were obtained using MRA technique without contrast. COMPARISON:  11/27/2016 CT head FINDINGS: MRI HEAD FINDINGS Brain: No reduced diffusion to suggest acute or early subacute infarct. No focal mass effect or hydrocephalus. Vascular: As below. Skull and upper cervical spine: Normal marrow signal. Sinuses/Orbits: Mild diffuse paranasal sinus mucosal thickening. No abnormal signal of mastoid air cells. Orbits are unremarkable. Other: None. MRA HEAD FINDINGS Motion degradation, suboptimal assessment for stenosis or aneurysm. Anterior circulation:  No large vessel occlusion. Posterior circulation: No large vessel occlusion. Anatomic variant: Patent anterior and bilateral posterior communicating arteries. IMPRESSION: 1. No acute intracranial abnormality identified. 2. Motion degraded MRA.  No large vessel occlusion. Electronically Signed   By: Mitzi Hansen M.D.   On: 11/28/2016 03:39   Ct Angio Chest/abd/pel For Dissection W And/or Wo Contrast  Result Date: 11/27/2016 CLINICAL DATA:  Chest pain EXAM: CT ANGIOGRAPHY CHEST, ABDOMEN AND PELVIS  TECHNIQUE: Multidetector CT imaging through the chest, abdomen and pelvis was performed using the standard protocol during bolus administration of intravenous contrast. Multiplanar reconstructed images and MIPs were obtained and reviewed to evaluate the vascular anatomy. CONTRAST:  100 mL Isovue 370. COMPARISON:  None. FINDINGS: CTA CHEST FINDINGS Cardiovascular: Thoracic aorta shows a normal branching pattern. No aneurysmal dilatation or dissection is noted. Pulmonary artery is well visualize with a normal branching pattern. No evidence of pulmonary embolism is seen. No cardiac enlargement is noted. No coronary calcifications are seen. Mediastinum/Nodes: No enlarged mediastinal, hilar, or axillary lymph nodes. Thyroid gland, trachea, and esophagus demonstrate no significant findings. Lungs/Pleura: Mild bibasilar atelectatic changes are noted. No focal confluent infiltrate is seen. No sizable effusion is noted. No pulmonary nodules are seen. Musculoskeletal: No chest wall abnormality. No acute or significant osseous findings. Review of the MIP images confirms the above findings. CTA ABDOMEN AND PELVIS FINDINGS VASCULAR Aorta: Mild atherosclerotic changes are noted without aneurysmal dilatation. Celiac: Patent without evidence of aneurysm, dissection, vasculitis or significant stenosis. SMA: Patent without evidence of aneurysm, dissection, vasculitis or significant stenosis. Renals: Dual renal arteries are noted on the left. Single renal artery on the right. They are widely patent. IMA: Patent without evidence of aneurysm, dissection, vasculitis or significant stenosis. Iliacs: Widely patent Veins: Within normal limits. Review of the MIP images confirms the above findings. NON-VASCULAR Hepatobiliary: No focal liver abnormality is seen. No gallstones, gallbladder wall thickening, or biliary dilatation. Pancreas: Unremarkable. No pancreatic ductal dilatation or surrounding inflammatory changes. Spleen: Normal in size  without focal abnormality. Adrenals/Urinary Tract: Adrenal glands are unremarkable. Kidneys are normal, without renal calculi, focal lesion, or hydronephrosis. Bladder is unremarkable. Stomach/Bowel: Stomach is within normal  limits. Appendix appears normal. No evidence of bowel wall thickening, distention, or inflammatory changes. Lymphatic: No significant adenopathy is noted. Reproductive: Prostate is unremarkable. Other: No abdominal wall hernia or abnormality. No abdominopelvic ascites. Musculoskeletal: No acute or significant osseous findings. Review of the MIP images confirms the above findings. IMPRESSION: No evidence of aortic dissection or pulmonary emboli. Mild bibasilar atelectatic changes. No other focal abnormality is seen. Electronically Signed   By: Alcide CleverMark  Lukens M.D.   On: 11/27/2016 14:43      Subjective: Patient reports continued left arm weakness.  Discharge Exam: Vitals:   11/28/16 0500 11/28/16 0658  BP: 125/72 112/77  Pulse: 64 67  Resp: 18 18  Temp: 97.9 F (36.6 C)   SpO2: 98% 98%   Vitals:   11/27/16 2301 11/28/16 0100 11/28/16 0500 11/28/16 0658  BP:  (!) 141/80 125/72 112/77  Pulse: 65 75 64 67  Resp: 18 18 18 18   Temp: 98.1 F (36.7 C)  97.9 F (36.6 C)   TempSrc: Oral  Oral   SpO2: 97% 100% 98% 98%  Weight: 114 kg (251 lb 5.2 oz)     Height: 6' (1.829 m)       General: Pt is alert, awake, not in acute distress Cardiovascular: RRR, S1/S2 +, no rubs, no gallops Respiratory: CTA bilaterally, no wheezing, no rhonchi Abdominal: Soft, NT, ND, bowel sounds + Musculoskeletal: no edema, no cyanosis, tenderness over left trapezius muscle medial to scapula Neuro: Left arm weakness when asking patient to raise arm purposely, however, when discussing other topics, patient's arm was moving freely through their without difficulty.   The results of significant diagnostics from this hospitalization (including imaging, microbiology, ancillary and laboratory) are  listed below for reference.     Microbiology: No results found for this or any previous visit (from the past 240 hour(s)).   Labs: BNP (last 3 results) No results for input(s): BNP in the last 8760 hours. Basic Metabolic Panel:  Recent Labs Lab 11/27/16 1320  NA 138  K 4.4  CL 107  CO2 25  GLUCOSE 100*  BUN 13  CREATININE 1.12  CALCIUM 8.7*   Liver Function Tests:  Recent Labs Lab 11/27/16 1320  AST 26  ALT 28  ALKPHOS 40  BILITOT 0.5  PROT 6.1*  ALBUMIN 3.6   No results for input(s): LIPASE, AMYLASE in the last 168 hours. No results for input(s): AMMONIA in the last 168 hours. CBC:  Recent Labs Lab 11/27/16 1320  WBC 6.5  HGB 14.1  HCT 42.3  MCV 93.6  PLT 227   Cardiac Enzymes:  Recent Labs Lab 11/27/16 1320 11/27/16 1824  TROPONINI <0.03 <0.03    SIGNED:   Jacquelin Hawkingalph Cyndel Griffey, MD [facility] 11/28/2016, 9:19 AM Pager [phone]  If 7PM-7AM, please contact night-coverage [email or web address] Password [email or web address]

## 2016-11-28 NOTE — Discharge Instructions (Signed)
Garrett BullocksBrian Dudley,  You were in the hospital for concern of a stroke because of your arm weakness. You did not have a stroke. Please follow-up with a primary care physician. I will prescribe a muscle relaxer for you on discharge.

## 2016-11-28 NOTE — Progress Notes (Signed)
SLP Cancellation Note  Patient Details Name: Garrett BullocksBrian Dudley MRN: [id number] DOB: Mar 25, 1976   Cancelled treatment:       Reason Eval/Treat Not Completed: SLP screened, no needs identified, will sign off. MRI negative for stroke, pt and family member in room report no changes in speech/ language/ cognitive skills. Pt conversing clearly at bedside, no acute needs noted. Will sign off; please re-consult if needs arise.   Metro KungAmy K Jolee Critcher, MA, CCC-SLP 11/28/2016, 10:12 AM  [phone]

## 2016-11-28 NOTE — Progress Notes (Signed)
Back from MRI.

## 2016-12-07 NOTE — Progress Notes (Signed)
OT Note  - Addendum    11/28/16 0950  OT Visit Information  Last OT Received On 12/15/16  OT G-codes **NOT FOR INPATIENT CLASS**  Functional Limitation Self care  Self Care Current Status (O1157) CI  Self Care Goal Status (W6203) CI  Self Care Discharge Status [phone]) CI  [facility], OT/L  [phone] 11/28/2016

## 2017-12-20 ENCOUNTER — Other Ambulatory Visit: Payer: Self-pay

## 2017-12-20 ENCOUNTER — Emergency Department (HOSPITAL_COMMUNITY)
Admission: EM | Admit: 2017-12-20 | Discharge: 2017-12-20 | Disposition: A | Discharge status: Home / Self Care | Payer: Self-pay | Attending: Emergency Medicine | Admitting: Emergency Medicine

## 2017-12-20 ENCOUNTER — Encounter (HOSPITAL_COMMUNITY): Payer: Self-pay | Admitting: Emergency Medicine

## 2017-12-20 DIAGNOSIS — Z87891 Personal history of nicotine dependence: Secondary | ICD-10-CM | POA: Insufficient documentation

## 2017-12-20 DIAGNOSIS — B029 Zoster without complications: Secondary | ICD-10-CM | POA: Insufficient documentation

## 2017-12-20 MED ORDER — VALACYCLOVIR HCL 1 G PO TABS: 1000.0000 mg | ORAL_TABLET | Freq: Three times a day (TID) | ORAL | 0 refills | Status: AC

## 2017-12-20 MED ORDER — IBUPROFEN 800 MG PO TABS: 800.0000 mg | ORAL_TABLET | Freq: Three times a day (TID) | ORAL | 0 refills | Status: DC | PRN

## 2017-12-20 MED ORDER — OXYCODONE-ACETAMINOPHEN 5-325 MG PO TABS: 1.0000 | ORAL_TABLET | Freq: Four times a day (QID) | ORAL | 0 refills | Status: DC | PRN

## 2017-12-20 MED ORDER — PREDNISONE 20 MG PO TABS: 60.0000 mg | ORAL_TABLET | Freq: Every day | ORAL | 0 refills | Status: AC

## 2017-12-20 NOTE — ED Triage Notes (Signed)
Patient complains of numbness tingling and burning on left side of back on Saturday. On Sunday patient states he notices rash form and that spread from back to around to left chest.

## 2017-12-20 NOTE — ED Provider Notes (Signed)
Emergency Department Provider Note   I have reviewed the triage vital signs and the nursing notes.   HISTORY  Chief Complaint Herpes Zoster   HPI Garrett Dudley is a 42 y.o. male with PMH of TIA resents to the emergency department with numbness/tingling on the left side of his back followed by eruption of painful, burning rash.  The symptoms began 3 days ago.  He states initially he had the pain and itching which was followed by the rash.  States the rash is spread from his back around his chest.  Denies any fevers or chills.  No shortness of breath.  He has no rash in additional locations.  He did have chickenpox as a child.  He has never had shingles in the past.   Past Medical History:  Diagnosis Date  . Heart murmur   . Stroke [facility])    TIA 2012    Patient Active Problem List   Diagnosis Date Noted  . Left arm weakness 11/27/2016  . Chest pain 11/27/2016  . 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 Other malaise and fatigue 10/08/2013    Past Surgical History:  Procedure Laterality Date  . EYE SURGERY     Allergies Patient has no known allergies.  No family history on file.  Social History Social History   Tobacco Use  . Smoking status: Former Games developer  . Smokeless tobacco: Never Used  Substance Use Topics  . Alcohol use: No  . Drug use: No    Review of Systems  Constitutional: No fever/chills Eyes: No visual changes. ENT: No sore throat. Cardiovascular: Denies chest pain. Respiratory: Denies shortness of breath. Gastrointestinal: No abdominal pain.  No nausea, no vomiting.  No diarrhea.  No constipation. Genitourinary: Negative for dysuria. Musculoskeletal: Negative for back pain. Skin: Positive painful rash.  Neurological: Negative for headaches, focal weakness. Numb/tingliny feeling on the left near the rash.   10-point ROS otherwise negative.  ____________________________________________   PHYSICAL EXAM:  VITAL SIGNS: ED Triage Vitals  Enc  Vitals Group     BP 12/20/17 1840 128/68     Pulse Rate 12/20/17 1840 81     Resp 12/20/17 1840 18     Temp 12/20/17 1840 98.3 F (36.8 C)     Temp Source 12/20/17 1840 Oral     SpO2 12/20/17 1840 97 %     Weight 12/20/17 1842 225 lb (102.1 kg)     Height 12/20/17 1842 6' (1.829 m)     Pain Score 12/20/17 1841 9   Constitutional: Alert and oriented. Well appearing and in no acute distress. Eyes: Conjunctivae are normal.  Head: Atraumatic. Nose: No congestion/rhinnorhea. Mouth/Throat: Mucous membranes are moist.   Neck: No stridor.  Cardiovascular: Normal rate, regular rhythm. Good peripheral circulation. Grossly normal heart sounds.   Respiratory: Normal respiratory effort.  No retractions. Lungs CTAB. Gastrointestinal: No distention.  Musculoskeletal: No lower extremity tenderness nor edema. No gross deformities of extremities. Neurologic:  Normal speech and language. No gross focal neurologic deficits are appreciated.  Skin:  Skin is warm, dry and intact. Vesicular rash running in a dermatomal distribution from the left thoracic spine, under the breast, and ending over the sternum. Does not cross midline.   ____________________________________________   PROCEDURES  Procedure(s) performed:   Procedures  None ____________________________________________   INITIAL IMPRESSION / ASSESSMENT AND PLAN / ED COURSE  Pertinent labs & imaging results that were available during my care of the patient were reviewed by me and considered in my  medical decision making (see chart for details).  Patient presents to the emergency department with rash consistent with herpes zoster.  Not appear to be complicated.  Symptoms began 3 days prior.  Plan to start antivirals, steroid, and pain medication.  Discussed that pain and rash may persist for 2 to 3 weeks.  This is the patient's first case of zoster.  He is not known to be immune compromised. No concern for disseminated disease at this time.  No face/eye involvement.   At this time, I do not feel there is any life-threatening condition present. I have reviewed and discussed all results (EKG, imaging, lab, urine as appropriate), exam findings with patient. I have reviewed nursing notes and appropriate previous records.  I feel the patient is safe to be discharged home without further emergent workup. Discussed usual and customary return precautions. Patient and family (if present) verbalize understanding and are comfortable with this plan.  Patient will follow-up with their primary care provider. If they do not have a primary care provider, information for follow-up has been provided to them. All questions have been answered.   ____________________________________________  FINAL CLINICAL IMPRESSION(S) / ED DIAGNOSES  Final diagnoses:  Herpes zoster without complication    NEW OUTPATIENT MEDICATIONS STARTED DURING THIS VISIT:  New Prescriptions   IBUPROFEN (ADVIL,MOTRIN) 800 MG TABLET    Take 1 tablet (800 mg total) by mouth every 8 (eight) hours as needed for moderate pain.   OXYCODONE-ACETAMINOPHEN (PERCOCET/ROXICET) 5-325 MG TABLET    Take 1 tablet by mouth every 6 (six) hours as needed for severe pain.   PREDNISONE (DELTASONE) 20 MG TABLET    Take 3 tablets (60 mg total) by mouth daily for 5 days.   VALACYCLOVIR (VALTREX) 1000 MG TABLET    Take 1 tablet (1,000 mg total) by mouth 3 (three) times daily for 7 days.    Note:  This document was prepared using Dragon voice recognition software and may include unintentional dictation errors.  Alona Bene, MD Emergency Medicine    Dennison Mcdaid, Arlyss Repress, MD 12/20/17 [phone]

## 2017-12-20 NOTE — Discharge Instructions (Signed)
You were seen in the ED today with a painful rash. This is from a virus called shingles. We are treating you with medication for this but you will likely have the rash and pain for several weeks. Do not drive a car while taking percocet. Your rash is contagious to others, especially the very young and very old. Try to avoid these individuals if possible.

## 2018-09-21 ENCOUNTER — Encounter (HOSPITAL_COMMUNITY): Payer: Self-pay | Admitting: Emergency Medicine

## 2018-09-21 ENCOUNTER — Other Ambulatory Visit: Payer: Self-pay

## 2018-09-21 ENCOUNTER — Emergency Department (HOSPITAL_COMMUNITY)
Admission: EM | Admit: 2018-09-21 | Discharge: 2018-09-21 | Disposition: A | Discharge status: Home / Self Care | Payer: Self-pay | Attending: Emergency Medicine | Admitting: Emergency Medicine

## 2018-09-21 ENCOUNTER — Emergency Department (HOSPITAL_COMMUNITY): Payer: Self-pay

## 2018-09-21 DIAGNOSIS — M546 Pain in thoracic spine: Secondary | ICD-10-CM | POA: Insufficient documentation

## 2018-09-21 DIAGNOSIS — Z87891 Personal history of nicotine dependence: Secondary | ICD-10-CM | POA: Insufficient documentation

## 2018-09-21 MED ORDER — NAPROXEN 500 MG PO TABS: 500.0000 mg | ORAL_TABLET | Freq: Two times a day (BID) | ORAL | 0 refills | Status: AC

## 2018-09-21 MED ORDER — HYDROCODONE-ACETAMINOPHEN 5-325 MG PO TABS
1.0000 | ORAL_TABLET | Freq: Once | ORAL | Status: AC
  Administered 2018-09-21: 1 via ORAL
  Filled 2018-09-21: qty 1

## 2018-09-21 MED ORDER — CYCLOBENZAPRINE HCL 10 MG PO TABS
10.0000 mg | ORAL_TABLET | Freq: Once | ORAL | Status: AC
  Administered 2018-09-21: 10 mg via ORAL
  Filled 2018-09-21: qty 1

## 2018-09-21 MED ORDER — CYCLOBENZAPRINE HCL 10 MG PO TABS: 10.0000 mg | ORAL_TABLET | Freq: Two times a day (BID) | ORAL | 0 refills | Status: AC | PRN

## 2018-09-21 NOTE — ED Triage Notes (Signed)
Patient reports R sided back pain, difficulty to walk and move.

## 2018-09-21 NOTE — ED Provider Notes (Signed)
[facility] Provider Note   CSN: [id number] Arrival date & time: 09/21/18  1638    History   Chief Complaint Chief Complaint  Patient presents with  . Back Pain    HPI Garrett Dudley is a 43 y.o. male presenting to the emergency department with complaint of right-sided back pain that began this morning.  Pain is located just medial to his right shoulder blade and extending down to his mid back.  It is worse with movement and palpation.  He has not taken any medications or done any interventions at home prior to arrival.  No known injuries.  No pleuritic chest/back pain, cough, URI symptoms, abdominal pain.  No history of DVT/PE, recent surgery/trauma, hemoptysis, unilateral leg pain or swelling, recent immobilization, or history of cancer.     The history is provided by the patient.    Past Medical History:  Diagnosis Date  . Heart murmur   . Stroke [facility])    TIA 2012    Patient Active Problem List   Diagnosis Date Noted  . Left arm weakness 11/27/2016  . Chest pain 11/27/2016  . 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 Other malaise and fatigue 10/08/2013    Past Surgical History:  Procedure Laterality Date  . EYE SURGERY          Home Medications    Prior to Admission medications   Medication Sig Start Date End Date Taking? Authorizing Provider  cyclobenzaprine (FLEXERIL) 10 MG tablet Take 1 tablet (10 mg total) by mouth 2 (two) times daily as needed for muscle spasms. 09/21/18   Sharad Vaneaton, [place] N, PA-C  naproxen (NAPROSYN) 500 MG tablet Take 1 tablet (500 mg total) by mouth 2 (two) times daily with a meal. 09/21/18   Karlie Aung, [place] N, PA-C    Family History History reviewed. No pertinent family history.  Social History Social History   Tobacco Use  . Smoking status: Former Games developermoker  . Smokeless tobacco: Never Used  Substance Use Topics  . Alcohol use: Yes    Comment: occas  . Drug use: No     Allergies   Patient has no known  allergies.   Review of Systems Review of Systems  Respiratory: Negative for cough and shortness of breath.   Cardiovascular: Negative for chest pain and leg swelling.  Gastrointestinal: Negative for abdominal pain.  Musculoskeletal: Positive for back pain.     Physical Exam Updated Vital Signs BP 118/81 (BP Location: Right Arm)   Pulse 94   Temp 98.2 F (36.8 C) (Oral)   Resp 18   Ht 6' (1.829 m)   Wt 102.1 kg   SpO2 96%   BMI 30.52 kg/m   Physical Exam Vitals signs and nursing note reviewed.  Constitutional:      General: He is not in acute distress.    Appearance: He is well-developed.  HENT:     Head: Normocephalic and atraumatic.  Eyes:     Conjunctiva/sclera: Conjunctivae normal.  Cardiovascular:     Rate and Rhythm: Normal rate and regular rhythm.  Pulmonary:     Effort: Pulmonary effort is normal. No respiratory distress.     Breath sounds: Normal breath sounds.  Abdominal:     General: Bowel sounds are normal.     Tenderness: There is no abdominal tenderness. There is no guarding or rebound. Negative signs include Murphy's sign.  Musculoskeletal:       Back:     Comments: There is tenderness to the right  upper back medial to the scapula and just distal to the scapula in the musculature.  Pain with passive forward flexion of the right shoulder and abduction.  No midline spinal tenderness.  No rashes.  Neurological:     Mental Status: He is alert.     Comments: Normal tone.  5/5 strength in BUE and BLE including strong and equal grip strength and dorsiflexion/plantar flexion Sensory: Pinprick and light touch normal in BLE extremities.  Gait: normal gait and balance CV: distal pulses palpable throughout    Psychiatric:        Mood and Affect: Mood normal.        Behavior: Behavior normal.      ED Treatments / Results  Labs (all labs ordered are listed, but only abnormal results are displayed) Labs Reviewed - No data to display  EKG None   Radiology Dg Chest 2 View  Result Date: 09/21/2018 CLINICAL DATA:  43 year old male with right side pain, back pain for 2 days. EXAM: CHEST - 2 VIEW COMPARISON:  CTA chest 11/27/2016 and earlier. FINDINGS: Lung volumes and mediastinal contours are stable since 2017. No cardiomegaly. Visualized tracheal air column is within normal limits. No pneumothorax, pulmonary edema, pleural effusion or confluent pulmonary opacity. No acute osseous abnormality identified. Negative visible bowel gas pattern. IMPRESSION: No acute cardiopulmonary abnormality. Electronically Signed   By: Odessa Fleming M.D.   On: 09/21/2018 20:55    Procedures Procedures (including critical care time)  Medications Ordered in ED Medications  HYDROcodone-acetaminophen (NORCO/VICODIN) 5-325 MG per tablet 1 tablet (1 tablet Oral Given 09/21/18 2029)  cyclobenzaprine (FLEXERIL) tablet 10 mg (10 mg Oral Given 09/21/18 2029)     Initial Impression / Assessment and Plan / ED Course  I have reviewed the triage vital signs and the nursing notes.  Pertinent labs & imaging results that were available during my care of the patient were reviewed by me and considered in my medical decision making (see chart for details).        Patient presenting with right upper back pain that is worse with movement and palpation, likely musculoskeletal.  No recent injuries.  Exam is reassuring, pain is reproducible on exam with palpation and movement of the right arm.  Lungs are clear.  Abdomen is benign.  Low risk Wells/PERC negative.  No infectious symptoms.  Chest x-ray is negative.  Treated in the ED.  Will discharge with muscle relaxer and NSAIDs with instructions to follow-up outpatient if symptoms persist.  Symptomatic management discussed.  Patient agreeable plan and safe for discharge.  Final Clinical Impressions(s) / ED Diagnoses   Final diagnoses:  Acute right-sided thoracic back pain    ED Discharge Orders         Ordered    cyclobenzaprine  (FLEXERIL) 10 MG tablet  2 times daily PRN     09/21/18 2119    naproxen (NAPROSYN) 500 MG tablet  2 times daily with meals     09/21/18 2120           Gennavieve Huq, [place] N, PA-C 09/21/18 2130    Maia Plan, MD 09/22/18 1345

## 2018-09-21 NOTE — Discharge Instructions (Signed)
Please read instructions below. Apply ice to your back for 20 minutes at a time. You can take naproxen every 12 hours as needed for pain. You can take flexeril every 12 hours as needed for muscle spasm. Be aware this medication can make you drowsy, do not drive or drink alcohol while taking this medication. Return to the ER for if you develop shortness of breath, or new or concerning symptoms.

## 2021-03-31 IMAGING — DX CHEST - 2 VIEW
2 series · 2 of 2 positions shown · non-contrast
Comparison: CTA chest 11/27/2016 and earlier.

CLINICAL DATA: 43-year-old male with right side pain, back pain for
2 days.

EXAM:
CHEST - 2 VIEW

[chest pa]
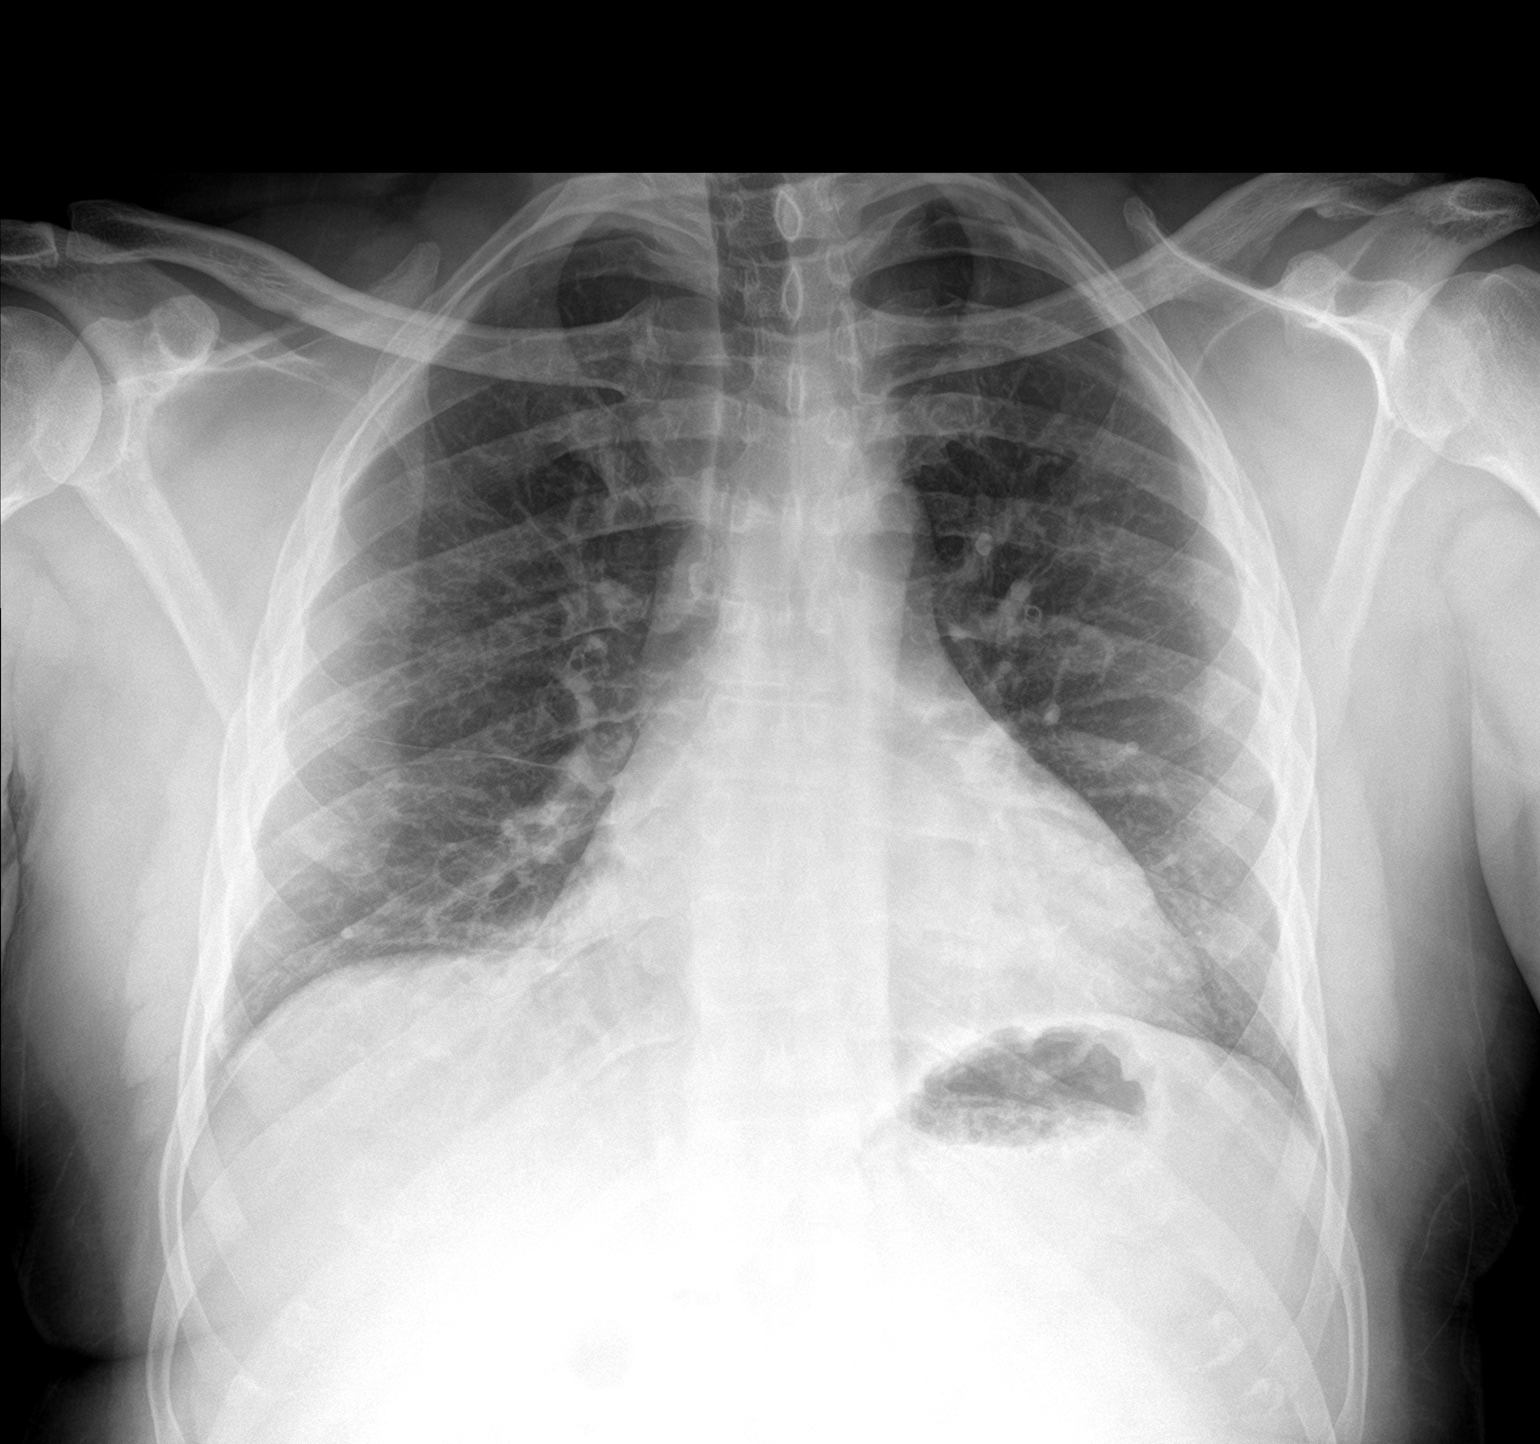

[chest lat]
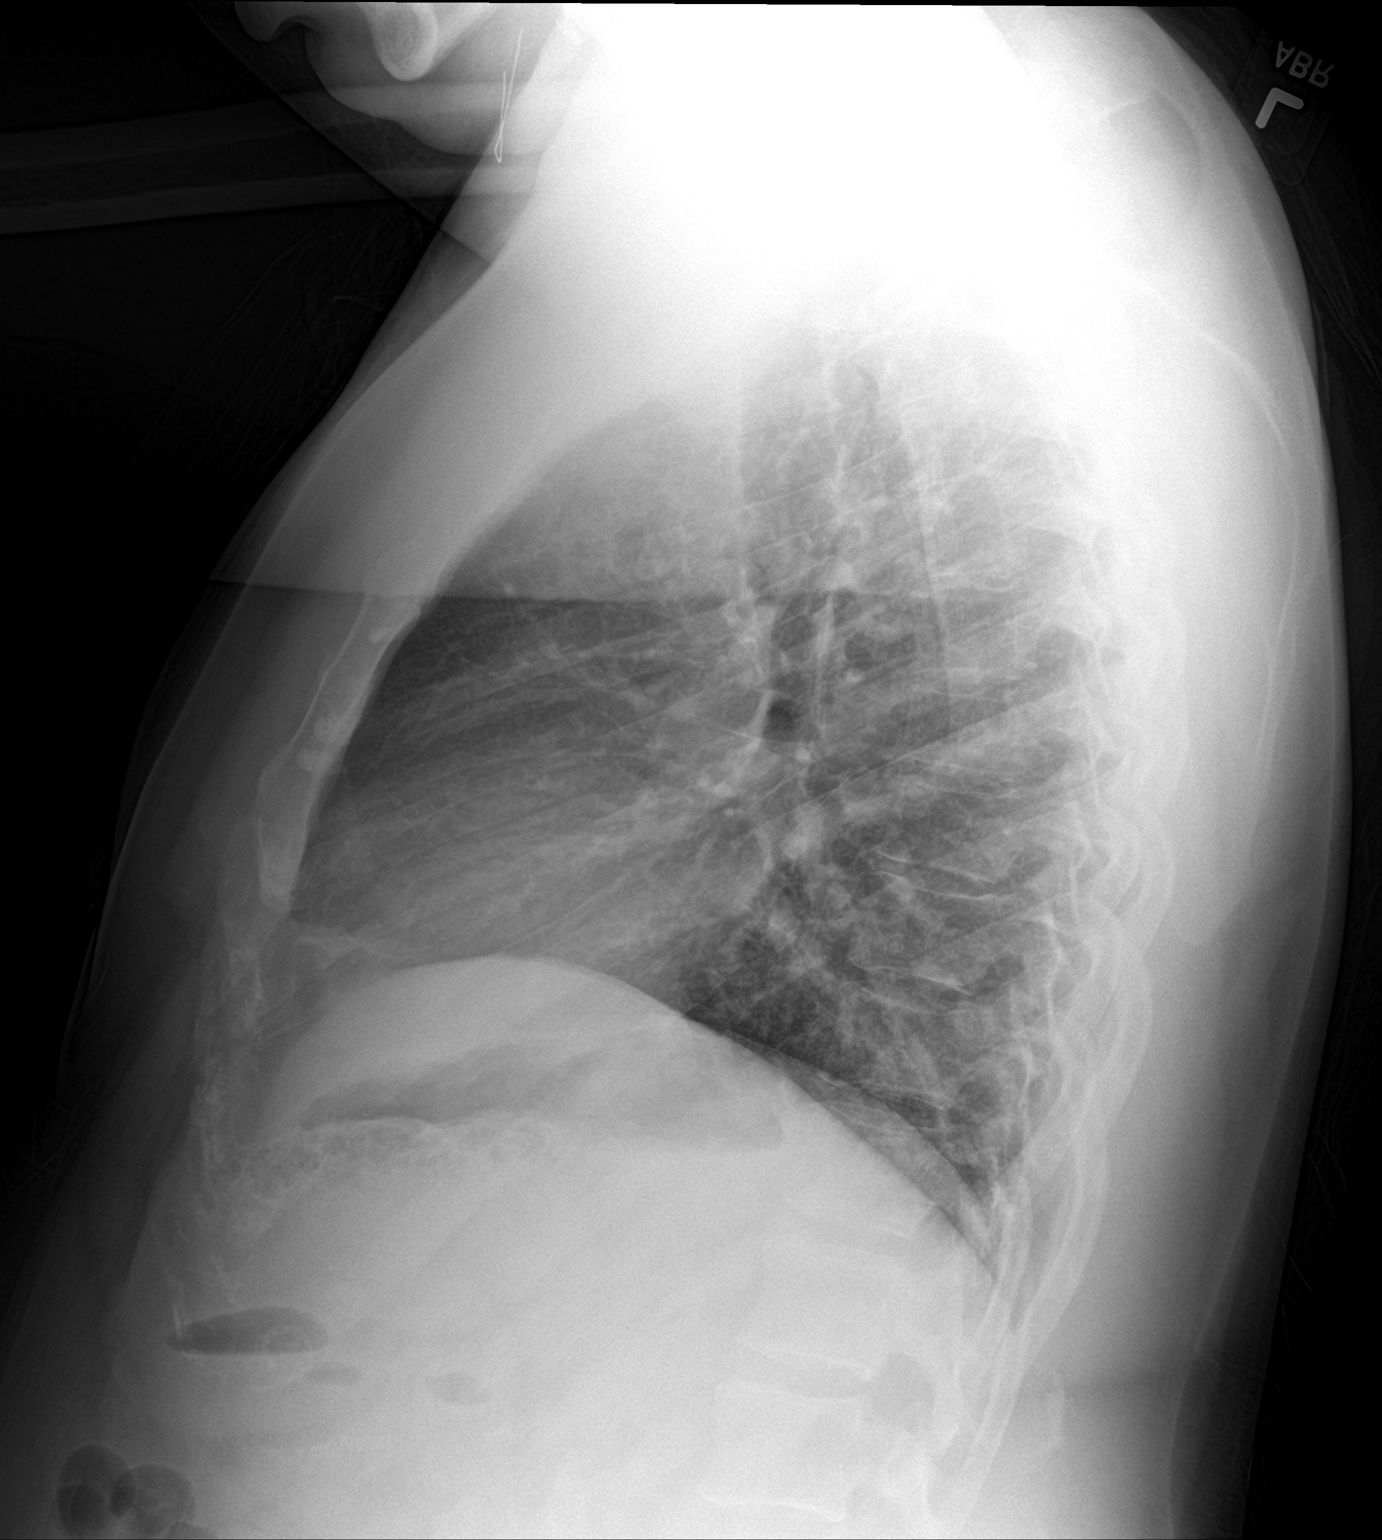

[2 of 2 positions shown; findings below may reference images not displayed]

FINDINGS: Lung volumes and mediastinal contours are stable since 1218. No
cardiomegaly. Visualized tracheal air column is within normal
limits. No pneumothorax, pulmonary edema, pleural effusion or
confluent pulmonary opacity. No acute osseous abnormality
identified. Negative visible bowel gas pattern.
IMPRESSION: No acute cardiopulmonary abnormality.
